# Patient Record
Sex: Male | Born: 1980
Health system: Southern US, Community
[De-identification: ages and names within clinical notes are randomized; demographics above are authoritative.]

## PROBLEM LIST (undated history)

## (undated) DIAGNOSIS — K859 Acute pancreatitis without necrosis or infection, unspecified: Secondary | ICD-10-CM

## (undated) DIAGNOSIS — K5792 Diverticulitis of intestine, part unspecified, without perforation or abscess without bleeding: Secondary | ICD-10-CM

## (undated) DIAGNOSIS — N200 Calculus of kidney: Secondary | ICD-10-CM

## (undated) DIAGNOSIS — R12 Heartburn: Secondary | ICD-10-CM

## (undated) HISTORY — DX: Acute pancreatitis without necrosis or infection, unspecified: K85.90

## (undated) HISTORY — DX: Diverticulitis of intestine, part unspecified, without perforation or abscess without bleeding: K57.92

## (undated) HISTORY — DX: Calculus of kidney: N20.0

## (undated) HISTORY — DX: Heartburn: R12

## (undated) HISTORY — PX: CHOLECYSTECTOMY: SHX55

---

## 2000-10-29 HISTORY — PX: CHOLECYSTECTOMY: SHX55

## 2003-10-30 DIAGNOSIS — K859 Acute pancreatitis without necrosis or infection, unspecified: Secondary | ICD-10-CM

## 2003-10-30 HISTORY — DX: Acute pancreatitis without necrosis or infection, unspecified: K85.90

## 2007-04-27 ENCOUNTER — Emergency Department: Payer: Self-pay | Admitting: Emergency Medicine

## 2011-03-31 ENCOUNTER — Emergency Department: Payer: Self-pay | Admitting: Emergency Medicine

## 2018-07-31 ENCOUNTER — Encounter: Payer: Self-pay | Admitting: Nurse Practitioner

## 2018-07-31 ENCOUNTER — Ambulatory Visit (INDEPENDENT_AMBULATORY_CARE_PROVIDER_SITE_OTHER): Payer: Commercial Managed Care - PPO | Admitting: Nurse Practitioner

## 2018-07-31 ENCOUNTER — Other Ambulatory Visit: Payer: Self-pay

## 2018-07-31 VITALS — BP 121/68 | HR 84 | Temp 98.3°F | Ht 64.0 in | Wt 160.4 lb

## 2018-07-31 DIAGNOSIS — N5089 Other specified disorders of the male genital organs: Secondary | ICD-10-CM | POA: Diagnosis not present

## 2018-07-31 DIAGNOSIS — Z7689 Persons encountering health services in other specified circumstances: Secondary | ICD-10-CM

## 2018-07-31 DIAGNOSIS — Z789 Other specified health status: Secondary | ICD-10-CM

## 2018-07-31 DIAGNOSIS — R42 Dizziness and giddiness: Secondary | ICD-10-CM

## 2018-07-31 NOTE — Patient Instructions (Addendum)
Carlos Davis,   Thank you for coming in to clinic today.  1. Because of your history of testicular cancer, would recommend ultrasound and evaluation for testicular cancer.  2. Drink plenty of fluids with electrolytes to help reduce your dizziness. - Consider slowly increasing the amount of carbohydrate you are consuming to prevent dizziness. - If not increasing carbs, increase some of your fat intake until you are reaching your weight loss goals for your Keto/low carb diet.   You will be due for FASTING BLOOD WORK.  This means you should eat no food or drink after midnight.  Drink only water or coffee without cream/sugar on the morning of your lab visit. - Please go ahead and schedule a "Lab Only" visit in the morning at the clinic for lab draw in the next 7 days. - Your results will be available about 2-3 days after blood draw.  If you have set up a MyChart account, you can can log in to MyChart online to view your results and a brief explanation. Also, we can discuss your results together at your next office visit if you would like.  Please schedule a follow-up appointment with Wilhelmina Mcardle, AGNP. Return in about 3 months (around 10/31/2018) for annual physical.  If you have any other questions or concerns, please feel free to call the clinic or send a message through MyChart. You may also schedule an earlier appointment if necessary.  You will receive a survey after today's visit either digitally by e-mail or paper by [employer]. Your experiences and feedback matter to [place].  Please respond so we know how we are doing as we provide care for you.   Wilhelmina Mcardle, DNP, AGNP-BC Adult Gerontology Nurse Practitioner [facility], [facility]

## 2018-07-31 NOTE — Progress Notes (Signed)
Subjective:    Patient ID: Carlos Davis, male    DOB: May 11, 1981, 37 y.o.   MRN: [id number]  Carlos Davis is a 37 y.o. male presenting on 07/31/2018 for Establish Care (lump on left testicle x 2 weeks ago) and Dizziness (pt currently on the Keto diet x 9 mths and been noticing over the pass three months he's been having symptoms of feeling really lightheadedness and admits sometimes he feel like everything is spinning )   HPI Establish Care New Provider Pt has had no recent PCP.  Other care recently received at [facility] for bone spurs.  Cholecystectomy, pancreatitis 18 years ago.    Dizziness Has taken breaks from pseudo-Keto - low carb (< 30g) - not high fat, Last break 2 weeks ago.  Dizziness x 3-4 months not daily.  Occurs most often in mornings.  Worst was at work.  Ate candy and felt much better.   - Orthostasis, worse with moving from bending, standing up rapidly.  Testicular Lump LEFT Present x 2 months or longer.  Is not painful.  1 year ago was having pain that radiated up to stomach lasted x 1 week and resolved.   - Fam history testicular cancer pat uncle.  Past Medical History:  Diagnosis Date  . Pancreatitis    Past Surgical History:  Procedure Laterality Date  . CHOLECYSTECTOMY    . CHOLECYSTECTOMY  2002   Social History   Socioeconomic History  . Marital status: Married    Spouse name: Not on file  . Number of children: 1  . Years of education: Not on file  . Highest education level: GED or equivalent  Occupational History  . Not on file  Social Needs  . Financial resource strain: Not on file  . Food insecurity:    Worry: Not on file    Inability: Not on file  . Transportation needs:    Medical: Not on file    Non-medical: Not on file  Tobacco Use  . Smoking status: Current Every Day Smoker    Packs/day: 0.10    Types: Cigarettes  . Smokeless tobacco: Current User    Types: Snuff  . Tobacco comment: uses 1 can dip per day, 1 pack  cigs per month  Substance and Sexual Activity  . Alcohol use: Yes    Alcohol/week: 12.0 standard drinks    Types: 12 Cans of beer per week    Frequency: Never    Comment: weekly  . Drug use: Not Currently    Comment: > 18 years ago, used hallucinogenics x 5 years  . Sexual activity: Yes    Comment: Partner - IUD  Lifestyle  . Physical activity:    Days per week: Not on file    Minutes per session: Not on file  . Stress: Not on file  Relationships  . Social connections:    Talks on phone: Not on file    Gets together: Not on file    Attends religious service: Not on file    Active member of club or organization: Not on file    Attends meetings of clubs or organizations: Not on file    Relationship status: Not on file  . Intimate partner violence:    Fear of current or ex partner: Not on file    Emotionally abused: Not on file    Physically abused: Not on file    Forced sexual activity: Not on file  Other Topics Concern  . Not on  file  Social History Narrative  . Not on file   Family History  Problem Relation Age of Onset  . Diabetes Maternal Grandmother   . Diabetes Paternal Grandmother   . Charcot-Marie-Tooth disease Mother   . Arthritis Father   . High blood pressure Father   . Charcot-Marie-Tooth disease Other   . Other Daughter        Hydronephrosis (intrauterine)  . Colon cancer Maternal Grandfather   . Heart attack Maternal Grandfather   . Aneurysm Paternal Grandfather        Brain, sudden death  . Charcot-Marie-Tooth disease Other    No current outpatient medications on file prior to visit.   No current facility-administered medications on file prior to visit.     Review of Systems  Constitutional: Negative for activity change, appetite change, fatigue and unexpected weight change.  HENT: Negative for congestion, hearing loss and trouble swallowing.   Eyes: Negative for visual disturbance.  Respiratory: Negative for choking, shortness of breath and  wheezing.   Cardiovascular: Negative for chest pain and palpitations.  Gastrointestinal: Negative for abdominal pain, blood in stool, constipation and diarrhea.  Genitourinary: Negative for difficulty urinating, discharge, flank pain, genital sores, penile pain, penile swelling, scrotal swelling and testicular pain.  Musculoskeletal: Negative for arthralgias, back pain and myalgias.  Skin: Negative for color change, rash and wound.  Allergic/Immunologic: Negative for environmental allergies.  Neurological: Negative for dizziness, seizures, weakness and headaches.  Psychiatric/Behavioral: Negative for behavioral problems, decreased concentration, dysphoric mood, sleep disturbance and suicidal ideas. The patient is not nervous/anxious.    Per HPI unless specifically indicated above     Objective:    BP 121/68 (BP Location: Right Arm, Patient Position: Sitting, Cuff Size: Normal)   Pulse 84   Temp 98.3 F (36.8 C) (Oral)   Ht 5\' 4"  (1.626 m)   Wt 160 lb 6.4 oz (72.8 kg)   BMI 27.53 kg/m   Wt Readings from Last 3 Encounters:  07/31/18 160 lb 6.4 oz (72.8 kg)    Physical Exam  Constitutional: He is oriented to person, place, and time. He appears well-developed and well-nourished. No distress.  HENT:  Head: Normocephalic and atraumatic.  Cardiovascular: Normal rate, regular rhythm, normal heart sounds and intact distal pulses.  Pulmonary/Chest: Effort normal and breath sounds normal. No respiratory distress.  Neurological: He is alert and oriented to person, place, and time.  Skin: Skin is warm and dry.  Psychiatric: He has a normal mood and affect. His behavior is normal.  Vitals reviewed.    Results for orders placed or performed in visit on 07/31/18  COMPLETE METABOLIC PANEL WITH GFR  Result Value Ref Range   Glucose, Bld 89 65 - 99 mg/dL   BUN 18 7 - 25 mg/dL   Creat 8.11 9.14 - 7.82 mg/dL   GFR, Est Non African American 115 > OR = 60 mL/min/1.14m2   GFR, Est African  American 134 > OR = 60 mL/min/1.49m2   BUN/Creatinine Ratio NOT APPLICABLE 6 - 22 (calc)   Sodium 141 135 - 146 mmol/L   Potassium 4.7 3.5 - 5.3 mmol/L   Chloride 106 98 - 110 mmol/L   CO2 27 20 - 32 mmol/L   Calcium 9.8 8.6 - 10.3 mg/dL   Total Protein 7.0 6.1 - 8.1 g/dL   Albumin 5.0 3.6 - 5.1 g/dL   Globulin 2.0 1.9 - 3.7 g/dL (calc)   AG Ratio 2.5 1.0 - 2.5 (calc)   Total Bilirubin 0.4 0.2 -  1.2 mg/dL   Alkaline phosphatase (APISO) 58 40 - 115 U/L   AST 14 10 - 40 U/L   ALT 19 9 - 46 U/L  Magnesium  Result Value Ref Range   Magnesium 2.1 1.5 - 2.5 mg/dL  Phosphorus  Result Value Ref Range   Phosphorus 3.7 2.5 - 4.5 mg/dL  CBC with Differential/Platelet  Result Value Ref Range   WBC 7.7 3.8 - 10.8 Thousand/uL   RBC 5.22 4.20 - 5.80 Million/uL   Hemoglobin 14.8 13.2 - 17.1 g/dL   HCT 16.1 09.6 - 04.5 %   MCV 86.0 80.0 - 100.0 fL   MCH 28.4 27.0 - 33.0 pg   MCHC 33.0 32.0 - 36.0 g/dL   RDW 40.9 81.1 - 91.4 %   Platelets 251 140 - 400 Thousand/uL   MPV 10.0 7.5 - 12.5 fL   Neutro Abs 5,020 1,500 - 7,800 cells/uL   Lymphs Abs 1,840 850 - 3,900 cells/uL   WBC mixed population 570 200 - 950 cells/uL   Eosinophils Absolute 231 15 - 500 cells/uL   Basophils Absolute 39 0 - 200 cells/uL   Neutrophils Relative % 65.2 %   Total Lymphocyte 23.9 %   Monocytes Relative 7.4 %   Eosinophils Relative 3.0 %   Basophils Relative 0.5 %  TSH  Result Value Ref Range   TSH 1.02 0.40 - 4.50 mIU/L      Assessment & Plan:   Problem List Items Addressed This Visit    None    Visit Diagnoses    Dizziness    -  Primary See AP patient on ketogenic diet.   Relevant Orders   COMPLETE METABOLIC PANEL WITH GFR (Completed)   Magnesium (Completed)   Phosphorus (Completed)   CBC with Differential/Platelet (Completed)   TSH (Completed)   Encounter to establish care     Previous PCP was > 10 years ago.  Records will not be requested.  Past medical, family, and surgical history reviewed w/  pt.     Patient on ketogenic diet     Patient with dizziness and is on keto diet which typically can cause these symptoms.   - Encouraged patient to increase fluids with electrolytes - Consider transitioning off keto to low glycemic/low carb (up to 80 g per day),  - Labs to check for causes of dizziness. - FOLLOW-UP prn   Relevant Orders   COMPLETE METABOLIC PANEL WITH GFR (Completed)   Magnesium (Completed)   Phosphorus (Completed)   CBC with Differential/Platelet (Completed)   Testicular lump     New testicular lump with fam history testicular cancer.   - Recommend referral to urology for ultrasound to screen.  Low risk for age. - Follow-up prn.   Relevant Orders   Ambulatory referral to Urology       Follow up plan: Return in about 3 months (around 10/31/2018) for annual physical.  Wilhelmina Mcardle, DNP, AGPCNP-BC Adult Gerontology Primary Care Nurse Practitioner [facility] 07/31/2018, 2:27 PM

## 2018-08-01 ENCOUNTER — Other Ambulatory Visit: Payer: Commercial Managed Care - PPO

## 2018-08-01 DIAGNOSIS — R42 Dizziness and giddiness: Secondary | ICD-10-CM | POA: Diagnosis not present

## 2018-08-01 DIAGNOSIS — Z789 Other specified health status: Secondary | ICD-10-CM | POA: Diagnosis not present

## 2018-08-02 LAB — CBC WITH DIFFERENTIAL/PLATELET
Basophils Absolute: 39 cells/uL (ref 0–200)
Basophils Relative: 0.5 %
Eosinophils Absolute: 231 cells/uL (ref 15–500)
Eosinophils Relative: 3 %
HCT: 44.9 % (ref 38.5–50.0)
Hemoglobin: 14.8 g/dL (ref 13.2–17.1)
Lymphs Abs: 1840 cells/uL (ref 850–3900)
MCH: 28.4 pg (ref 27.0–33.0)
MCHC: 33 g/dL (ref 32.0–36.0)
MCV: 86 fL (ref 80.0–100.0)
MPV: 10 fL (ref 7.5–12.5)
Monocytes Relative: 7.4 %
Neutro Abs: 5020 cells/uL (ref 1500–7800)
Neutrophils Relative %: 65.2 %
Platelets: 251 10*3/uL (ref 140–400)
RBC: 5.22 10*6/uL (ref 4.20–5.80)
RDW: 12.8 % (ref 11.0–15.0)
Total Lymphocyte: 23.9 %
WBC mixed population: 570 cells/uL (ref 200–950)
WBC: 7.7 10*3/uL (ref 3.8–10.8)

## 2018-08-02 LAB — COMPLETE METABOLIC PANEL WITH GFR
AG Ratio: 2.5 (calc) (ref 1.0–2.5)
ALT: 19 U/L (ref 9–46)
AST: 14 U/L (ref 10–40)
Albumin: 5 g/dL (ref 3.6–5.1)
Alkaline phosphatase (APISO): 58 U/L (ref 40–115)
BUN: 18 mg/dL (ref 7–25)
CO2: 27 mmol/L (ref 20–32)
Calcium: 9.8 mg/dL (ref 8.6–10.3)
Chloride: 106 mmol/L (ref 98–110)
Creat: 0.78 mg/dL (ref 0.60–1.35)
GFR, Est African American: 134 mL/min/{1.73_m2} (ref >=60)
GFR, Est Non African American: 115 mL/min/{1.73_m2} (ref >=60)
Globulin: 2 g/dL (calc) (ref 1.9–3.7)
Glucose, Bld: 89 mg/dL (ref 65–99)
Potassium: 4.7 mmol/L (ref 3.5–5.3)
Sodium: 141 mmol/L (ref 135–146)
Total Bilirubin: 0.4 mg/dL (ref 0.2–1.2)
Total Protein: 7 g/dL (ref 6.1–8.1)

## 2018-08-02 LAB — PHOSPHORUS: Phosphorus: 3.7 mg/dL (ref 2.5–4.5)

## 2018-08-02 LAB — MAGNESIUM: Magnesium: 2.1 mg/dL (ref 1.5–2.5)

## 2018-08-02 LAB — TSH: TSH: 1.02 mIU/L (ref 0.40–4.50)

## 2018-08-07 ENCOUNTER — Ambulatory Visit: Payer: Commercial Managed Care - PPO | Admitting: Urology

## 2018-08-13 ENCOUNTER — Ambulatory Visit (INDEPENDENT_AMBULATORY_CARE_PROVIDER_SITE_OTHER): Payer: Commercial Managed Care - PPO | Admitting: Urology

## 2018-08-13 ENCOUNTER — Encounter: Payer: Self-pay | Admitting: Urology

## 2018-08-13 VITALS — BP 121/76 | Ht 65.0 in | Wt 165.1 lb

## 2018-08-13 DIAGNOSIS — N5089 Other specified disorders of the male genital organs: Secondary | ICD-10-CM | POA: Diagnosis not present

## 2018-08-13 NOTE — Progress Notes (Signed)
08/13/2018 2:57 PM   Carlos Davis [birth year] [id number]  Referring provider: Galen Manila, NP [address] [place], [place] [address]  Chief Complaint  Patient presents with  . Testicular Mass    HPI: Patient is a 37 year old Afro-American male who is referred to [place] by Camillia Herter, NP for a testicular lump.  He states that about 2 months ago he discovered a lump in his left hemiscrotum during self exam.  He states the lump is not painful.  It is not changed in size or consistency since it was first discovered.  He denies any history of infection, trauma or surgery to the genitourinary area.  Approximately 1 year ago he was having pain in his left testicle that radiated up into his abdomen which lasted 1 week and resolved.  He has no urinary symptoms today.  Patient denies any gross hematuria, dysuria or suprapubic/flank pain.  Patient denies any fevers, chills, nausea or vomiting.      He is not having difficulty with erections, penile discharge, discolored ejaculate fluid or pain with ejaculation.   His paternal uncle was diagnosed with testicular cancer.  PMH: Past Medical History:  Diagnosis Date  . Pancreatitis     Surgical History: Cholecystectomy 2002  Home Medications:  Allergies as of 08/13/2018   No Known Allergies     Medication List    as of 08/13/2018  2:57 PM   You have not been prescribed any medications.     Allergies: No Known Allergies  Family History: Family History  Problem Relation Age of Onset  . Diabetes Maternal Grandmother   . Diabetes Paternal Grandmother   . Charcot-Marie-Tooth disease Mother   . Arthritis Father   . High blood pressure Father   . Charcot-Marie-Tooth disease Other   . Other Daughter        Hydronephrosis (intrauterine)  . Colon cancer Maternal Grandfather   . Heart attack Maternal Grandfather   . Aneurysm Paternal Grandfather        Brain, sudden death  . Charcot-Marie-Tooth disease Other      Social History:  reports that he has been smoking cigarettes. He has been smoking about 0.25 packs per day. His smokeless tobacco use includes snuff. He reports that he drinks about 12.0 standard drinks of alcohol per week. He reports that he has current or past drug history.  ROS: UROLOGY Frequent Urination?: Yes Hard to postpone urination?: No Burning/pain with urination?: No Get up at night to urinate?: Yes Leakage of urine?: No Urine stream starts and stops?: No Trouble starting stream?: No Do you have to strain to urinate?: No Blood in urine?: No Urinary tract infection?: No Sexually transmitted disease?: No Injury to kidneys or bladder?: No Painful intercourse?: No Weak stream?: No Erection problems?: No Penile pain?: No  Gastrointestinal Nausea?: No Vomiting?: No Indigestion/heartburn?: No Diarrhea?: No Constipation?: No  Constitutional Fever: No Night sweats?: No Weight loss?: No Fatigue?: No  Skin Skin rash/lesions?: No Itching?: No  Eyes Blurred vision?: No Double vision?: No  Ears/Nose/Throat Sore throat?: No Sinus problems?: No  Hematologic/Lymphatic Swollen glands?: No Easy bruising?: No  Cardiovascular Leg swelling?: No Chest pain?: No  Respiratory Cough?: No Shortness of breath?: No  Endocrine Excessive thirst?: No  Musculoskeletal Back pain?: No Joint pain?: No  Neurological Headaches?: No Dizziness?: No  Psychologic Depression?: No Anxiety?: No  Physical Exam: BP 121/76 (BP Location: Left Arm, Patient Position: Sitting, Cuff Size: Normal)   Ht 5\' 5"  (1.651 m)  Wt 165 lb 1.6 oz (74.9 kg)   BMI 27.47 kg/m   Constitutional:  Well nourished. Alert and oriented, No acute distress. HEENT:  AT, moist mucus membranes.  Trachea midline, no masses. Cardiovascular: No clubbing, cyanosis, or edema. Respiratory: Normal respiratory effort, no increased work of breathing. GI: Abdomen is soft, non tender, non distended, no  abdominal masses. Liver and spleen not palpable.  No hernias appreciated.  Stool sample for occult testing is not indicated.   GU: No CVA tenderness.  No bladder fullness or masses.  Patient with circumcised phallus.  Urethral meatus is patent.  No penile discharge. No penile lesions or rashes. Scrotum without lesions, cysts, rashes and/or edema.  Testicles are located scrotally bilaterally. No masses are appreciated in the testicles. Left epididymis mass non tender and non indurated.  Right epididymis is normal.   Rectal: Patient with  normal sphincter tone. Anus and perineum without scarring or rashes. No rectal masses are appreciated. Prostate is approximately 45 grams, no nodules are appreciated. Seminal vesicles are normal. Skin: No rashes, bruises or suspicious lesions. Lymph: No cervical or inguinal adenopathy. Neurologic: Grossly intact, no focal deficits, moving all 4 extremities. Psychiatric: Normal mood and affect.  Laboratory Data: Lab Results  Component Value Date   WBC 7.7 08/01/2018   HGB 14.8 08/01/2018   HCT 44.9 08/01/2018   MCV 86.0 08/01/2018   PLT 251 08/01/2018    Lab Results  Component Value Date   CREATININE 0.78 08/01/2018    No results found for: PSA  No results found for: TESTOSTERONE  No results found for: HGBA1C  Lab Results  Component Value Date   TSH 1.02 08/01/2018    No results found for: CHOL, HDL, CHOLHDL, VLDL, LDLCALC  Lab Results  Component Value Date   AST 14 08/01/2018   Lab Results  Component Value Date   ALT 19 08/01/2018   No components found for: ALKALINEPHOPHATASE No components found for: BILIRUBINTOTAL  No results found for: ESTRADIOL  Urinalysis No results found for: COLORURINE, APPEARANCEUR, LABSPEC, PHURINE, GLUCOSEU, HGBUR, BILIRUBINUR, KETONESUR, PROTEINUR, UROBILINOGEN, NITRITE, LEUKOCYTESUR  I have reviewed the labs.     Assessment & Plan:    1.  Left epididymal mass Likely an epididymal cyst, but will  obtain scrotal ultrasound for confirmation  Return for I will call patient with results.  These notes generated with voice recognition software. I apologize for typographical errors.  Michiel Cowboy, PA-C  [facility] [address]  [address] [place], [place] [address] [phone]

## 2018-08-20 ENCOUNTER — Ambulatory Visit
Admission: RE | Admit: 2018-08-20 | Discharge: 2018-08-20 | Disposition: A | Discharge status: Home / Self Care | Payer: Commercial Managed Care - PPO | Source: Ambulatory Visit | Attending: Urology | Admitting: Urology

## 2018-08-20 DIAGNOSIS — N503 Cyst of epididymis: Secondary | ICD-10-CM | POA: Diagnosis not present

## 2018-08-20 DIAGNOSIS — N5089 Other specified disorders of the male genital organs: Secondary | ICD-10-CM

## 2018-08-21 ENCOUNTER — Telehealth: Payer: Self-pay

## 2018-08-21 NOTE — Telephone Encounter (Signed)
-----   Message from Harle Battiest, PA-C sent at 08/21/2018  2:10 PM EDT ----- Please let the patient know that his scrotal ultrasound was negative for cancer and that the area that is palpated in his left scrotum is an epididymal cyst and it is benign.  No further follow-up is needed.

## 2018-08-21 NOTE — Telephone Encounter (Signed)
Patient notified

## 2018-09-24 ENCOUNTER — Encounter: Payer: Self-pay | Admitting: Nurse Practitioner

## 2018-10-31 ENCOUNTER — Other Ambulatory Visit: Payer: Self-pay

## 2018-10-31 ENCOUNTER — Ambulatory Visit (INDEPENDENT_AMBULATORY_CARE_PROVIDER_SITE_OTHER): Payer: Commercial Managed Care - PPO | Admitting: Nurse Practitioner

## 2018-10-31 ENCOUNTER — Encounter: Payer: Self-pay | Admitting: Nurse Practitioner

## 2018-10-31 VITALS — BP 111/65 | HR 67 | Temp 98.2°F | Ht 65.0 in | Wt 172.8 lb

## 2018-10-31 DIAGNOSIS — Z23 Encounter for immunization: Secondary | ICD-10-CM

## 2018-10-31 DIAGNOSIS — Z0000 Encounter for general adult medical examination without abnormal findings: Secondary | ICD-10-CM | POA: Diagnosis not present

## 2018-10-31 NOTE — Progress Notes (Signed)
Subjective:    Patient ID: Carlos Davis, male    DOB: [birth year], 38 y.o.   MRN: [id number]  Carlos Davis is a 38 y.o. male presenting on 10/31/2018 for Annual Exam   HPI Annual Physical Exam Patient has been feeling well.  They have no acute concerns today. Sleeps 8-9 hours per night usually uninterrupted, but occasionally 1 x for urination  HEALTH MAINTENANCE: Weight/BMI: Generally healthy, overweight Physical activity: work, but none regularly outside other than housework/yardwork Diet: Keto diet resumed after 3 months off. Seatbelt: always Sunscreen: not regular HIV: declines - due to low risk Optometry: not regularly Dentistry: not regular, has insurance  VACCINES: Tetanus: last when joined [facility] approx 10 years ago.   Influenza: due - patient declines - received from [facility] this past year   Past Medical History:  Diagnosis Date  . Pancreatitis    Past Surgical History:  Procedure Laterality Date  . CHOLECYSTECTOMY    . CHOLECYSTECTOMY  2002   Social History   Socioeconomic History  . Marital status: Married    Spouse name: Not on file  . Number of children: 1  . Years of education: Not on file  . Highest education level: GED or equivalent  Occupational History  . Not on file  Social Needs  . Financial resource strain: Not on file  . Food insecurity:    Worry: Not on file    Inability: Not on file  . Transportation needs:    Medical: Not on file    Non-medical: Not on file  Tobacco Use  . Smoking status: Current Every Day Smoker    Packs/day: 0.10    Types: Cigarettes  . Smokeless tobacco: Current User    Types: Snuff  . Tobacco comment: uses 1 can dip per day, 1 pack cigs per month  Substance and Sexual Activity  . Alcohol use: Yes    Alcohol/week: 12.0 standard drinks    Types: 12 Cans of beer per week    Frequency: Never    Comment: weekly  . Drug use: Not Currently    Comment: > 18 years ago, used hallucinogenics x 5 years  . Sexual  activity: Yes    Comment: Partner - IUD  Lifestyle  . Physical activity:    Days per week: Not on file    Minutes per session: Not on file  . Stress: Not on file  Relationships  . Social connections:    Talks on phone: Not on file    Gets together: Not on file    Attends religious service: Not on file    Active member of club or organization: Not on file    Attends meetings of clubs or organizations: Not on file    Relationship status: Not on file  . Intimate partner violence:    Fear of current or ex partner: Not on file    Emotionally abused: Not on file    Physically abused: Not on file    Forced sexual activity: Not on file  Other Topics Concern  . Not on file  Social History Narrative  . Not on file   Family History  Problem Relation Age of Onset  . Diabetes Maternal Grandmother   . Diabetes Paternal Grandmother   . Charcot-Marie-Tooth disease Mother   . Arthritis Father   . High blood pressure Father   . Charcot-Marie-Tooth disease Other   . Other Daughter        Hydronephrosis (intrauterine)  . Colon cancer Maternal Grandfather   .  Heart attack Maternal Grandfather   . Aneurysm Paternal Grandfather        Brain, sudden death  . Charcot-Marie-Tooth disease Other    No current outpatient medications on file prior to visit.   No current facility-administered medications on file prior to visit.     Review of Systems  Constitutional: Negative for activity change, appetite change, fatigue and unexpected weight change.  HENT: Negative for congestion, hearing loss and trouble swallowing.   Eyes: Negative for visual disturbance.  Respiratory: Negative for choking, shortness of breath and wheezing.   Cardiovascular: Negative for chest pain and palpitations.  Gastrointestinal: Negative for abdominal pain, blood in stool, constipation and diarrhea.  Genitourinary: Negative for difficulty urinating, discharge, flank pain, genital sores, penile pain, penile swelling,  scrotal swelling and testicular pain.  Musculoskeletal: Negative for arthralgias, back pain and myalgias.  Skin: Negative for color change, rash and wound.  Allergic/Immunologic: Negative for environmental allergies.  Neurological: Negative for dizziness, seizures, weakness and headaches.  Psychiatric/Behavioral: Negative for behavioral problems, decreased concentration, dysphoric mood, sleep disturbance and suicidal ideas. The patient is not nervous/anxious.    Per HPI unless specifically indicated above    Objective:    BP 111/65 (BP Location: Right Arm, Patient Position: Sitting, Cuff Size: Normal)   Pulse 67   Temp 98.2 F (36.8 C) (Oral)   Ht 5\' 5"  (1.651 m)   Wt 172 lb 12.8 oz (78.4 kg)   BMI 28.76 kg/m   Wt Readings from Last 3 Encounters:  10/31/18 172 lb 12.8 oz (78.4 kg)  08/13/18 165 lb 1.6 oz (74.9 kg)  07/31/18 160 lb 6.4 oz (72.8 kg)    Physical Exam Vitals signs and nursing note reviewed.  Constitutional:      General: He is not in acute distress.    Appearance: He is well-developed.  HENT:     Head: Normocephalic and atraumatic.     Right Ear: External ear normal.     Left Ear: External ear normal.     Nose: Nose normal.  Eyes:     Conjunctiva/sclera: Conjunctivae normal.     Pupils: Pupils are equal, round, and reactive to light.  Neck:     Musculoskeletal: Normal range of motion and neck supple.     Thyroid: No thyromegaly.     Vascular: No JVD.     Trachea: No tracheal deviation.  Cardiovascular:     Rate and Rhythm: Normal rate and regular rhythm.     Heart sounds: Normal heart sounds. No murmur. No friction rub. No gallop.   Pulmonary:     Effort: Pulmonary effort is normal. No respiratory distress.     Breath sounds: Normal breath sounds.  Abdominal:     General: Bowel sounds are normal. There is no distension.     Palpations: Abdomen is soft.     Tenderness: There is no abdominal tenderness.  Musculoskeletal: Normal range of motion.    Lymphadenopathy:     Cervical: No cervical adenopathy.  Skin:    General: Skin is warm and dry.     Capillary Refill: Capillary refill takes less than 2 seconds.  Neurological:     Mental Status: He is alert and oriented to person, place, and time.     Cranial Nerves: No cranial nerve deficit.  Psychiatric:        Behavior: Behavior normal.        Thought Content: Thought content normal.        Judgment: Judgment normal.  Results for orders placed or performed in visit on 07/31/18  COMPLETE METABOLIC PANEL WITH GFR  Result Value Ref Range   Glucose, Bld 89 65 - 99 mg/dL   BUN 18 7 - 25 mg/dL   Creat 1.61 0.96 - 0.45 mg/dL   GFR, Est Non African American 115 > OR = 60 mL/min/1.75m2   GFR, Est African American 134 > OR = 60 mL/min/1.67m2   BUN/Creatinine Ratio NOT APPLICABLE 6 - 22 (calc)   Sodium 141 135 - 146 mmol/L   Potassium 4.7 3.5 - 5.3 mmol/L   Chloride 106 98 - 110 mmol/L   CO2 27 20 - 32 mmol/L   Calcium 9.8 8.6 - 10.3 mg/dL   Total Protein 7.0 6.1 - 8.1 g/dL   Albumin 5.0 3.6 - 5.1 g/dL   Globulin 2.0 1.9 - 3.7 g/dL (calc)   AG Ratio 2.5 1.0 - 2.5 (calc)   Total Bilirubin 0.4 0.2 - 1.2 mg/dL   Alkaline phosphatase (APISO) 58 40 - 115 U/L   AST 14 10 - 40 U/L   ALT 19 9 - 46 U/L  Magnesium  Result Value Ref Range   Magnesium 2.1 1.5 - 2.5 mg/dL  Phosphorus  Result Value Ref Range   Phosphorus 3.7 2.5 - 4.5 mg/dL  CBC with Differential/Platelet  Result Value Ref Range   WBC 7.7 3.8 - 10.8 Thousand/uL   RBC 5.22 4.20 - 5.80 Million/uL   Hemoglobin 14.8 13.2 - 17.1 g/dL   HCT 40.9 81.1 - 91.4 %   MCV 86.0 80.0 - 100.0 fL   MCH 28.4 27.0 - 33.0 pg   MCHC 33.0 32.0 - 36.0 g/dL   RDW 78.2 95.6 - 21.3 %   Platelets 251 140 - 400 Thousand/uL   MPV 10.0 7.5 - 12.5 fL   Neutro Abs 5,020 1,500 - 7,800 cells/uL   Lymphs Abs 1,840 850 - 3,900 cells/uL   WBC mixed population 570 200 - 950 cells/uL   Eosinophils Absolute 231 15 - 500 cells/uL   Basophils  Absolute 39 0 - 200 cells/uL   Neutrophils Relative % 65.2 %   Total Lymphocyte 23.9 %   Monocytes Relative 7.4 %   Eosinophils Relative 3.0 %   Basophils Relative 0.5 %  TSH  Result Value Ref Range   TSH 1.02 0.40 - 4.50 mIU/L      Assessment & Plan:   Problem List Items Addressed This Visit    None    Visit Diagnoses    Encounter for annual physical exam    -  Primary   Relevant Orders   Hemoglobin A1c   CBC with Differential/Platelet   COMPLETE METABOLIC PANEL WITH GFR   Lipid panel   TSH   Need for diphtheria-tetanus-pertussis (Tdap) vaccine       Relevant Orders   Tdap vaccine greater than or equal to 7yo IM    Annual physical exam with no new findings.  Well adult with no acute concerns.  Plan: 1. Obtain health maintenance screenings as above according to age. - Increase physical activity to 30 minutes most days of the week.  - Eat healthy diet high in vegetables and fruits; low in refined carbohydrates. - Labs as above in next 7 days. - Patient due for Tdap - will receive today. 2. Return 1 year for annual physical.    Follow up plan: Return in about 1 year (around 11/01/2019) for annual physical.  Wilhelmina Mcardle, DNP, AGPCNP-BC Adult Gerontology Primary Care Nurse  Practitioner [facility] 10/31/2018, 2:28 PM

## 2018-10-31 NOTE — Patient Instructions (Addendum)
Carlos Davis,   Thank you for coming in to clinic today.  1. You will be due for FASTING BLOOD WORK.  This means you should eat no food or drink after midnight.  Drink only water or coffee without cream/sugar on the morning of your lab visit. - Please go ahead and schedule a "Lab Only" visit in the morning at the clinic for lab draw in the next 7 days. - Your results will be available about 2-3 days after blood draw.  If you have set up a MyChart account, you can can log in to MyChart online to view your results and a brief explanation. Also, we can discuss your results together at your next office visit if you would like.  2. Colon cancer screening for early family history at age 65.  Please schedule a follow-up appointment with Wilhelmina Mcardle, AGNP. Return in about 1 year (around 11/01/2019) for annual physical.  If you have any other questions or concerns, please feel free to call the clinic or send a message through MyChart. You may also schedule an earlier appointment if necessary.  You will receive a survey after today's visit either digitally by e-mail or paper by [employer]. Your experiences and feedback matter to [place].  Please respond so we know how we are doing as we provide care for you.   Wilhelmina Mcardle, DNP, AGNP-BC Adult Gerontology Nurse Practitioner [facility], [facility]

## 2018-11-03 ENCOUNTER — Encounter: Payer: Self-pay | Admitting: Nurse Practitioner

## 2018-11-05 DIAGNOSIS — Z0000 Encounter for general adult medical examination without abnormal findings: Secondary | ICD-10-CM | POA: Diagnosis not present

## 2018-11-06 LAB — CBC WITH DIFFERENTIAL/PLATELET
Absolute Monocytes: 529 cells/uL (ref 200–950)
Basophils Absolute: 19 cells/uL (ref 0–200)
Basophils Relative: 0.3 %
Eosinophils Absolute: 120 cells/uL (ref 15–500)
Eosinophils Relative: 1.9 %
HCT: 42.3 % (ref 38.5–50.0)
Hemoglobin: 14.8 g/dL (ref 13.2–17.1)
Lymphs Abs: 1758 cells/uL (ref 850–3900)
MCH: 29.4 pg (ref 27.0–33.0)
MCHC: 35 g/dL (ref 32.0–36.0)
MCV: 84.1 fL (ref 80.0–100.0)
MPV: 10.6 fL (ref 7.5–12.5)
Monocytes Relative: 8.4 %
Neutro Abs: 3875 cells/uL (ref 1500–7800)
Neutrophils Relative %: 61.5 %
Platelets: 237 10*3/uL (ref 140–400)
RBC: 5.03 10*6/uL (ref 4.20–5.80)
RDW: 12.9 % (ref 11.0–15.0)
Total Lymphocyte: 27.9 %
WBC: 6.3 10*3/uL (ref 3.8–10.8)

## 2018-11-06 LAB — COMPLETE METABOLIC PANEL WITH GFR
AG Ratio: 2.2 (calc) (ref 1.0–2.5)
ALT: 23 U/L (ref 9–46)
AST: 15 U/L (ref 10–40)
Albumin: 4.9 g/dL (ref 3.6–5.1)
Alkaline phosphatase (APISO): 53 U/L (ref 40–115)
BUN: 19 mg/dL (ref 7–25)
CO2: 22 mmol/L (ref 20–32)
Calcium: 9.7 mg/dL (ref 8.6–10.3)
Chloride: 106 mmol/L (ref 98–110)
Creat: 0.71 mg/dL (ref 0.60–1.35)
GFR, Est African American: 139 mL/min/{1.73_m2} (ref >=60)
GFR, Est Non African American: 120 mL/min/{1.73_m2} (ref >=60)
Globulin: 2.2 g/dL (calc) (ref 1.9–3.7)
Glucose, Bld: 88 mg/dL (ref 65–99)
Potassium: 4.3 mmol/L (ref 3.5–5.3)
Sodium: 139 mmol/L (ref 135–146)
Total Bilirubin: 0.4 mg/dL (ref 0.2–1.2)
Total Protein: 7.1 g/dL (ref 6.1–8.1)

## 2018-11-06 LAB — HEMOGLOBIN A1C
Hgb A1c MFr Bld: 5 % of total Hgb (ref <5.7)
Mean Plasma Glucose: 97 (calc)
eAG (mmol/L): 5.4 (calc)

## 2018-11-06 LAB — LIPID PANEL
Cholesterol: 191 mg/dL (ref <200)
HDL: 44 mg/dL (ref >40)
LDL Cholesterol (Calc): 132 mg/dL (calc) — ABNORMAL HIGH
Non-HDL Cholesterol (Calc): 147 mg/dL (calc) — ABNORMAL HIGH (ref <130)
Total CHOL/HDL Ratio: 4.3 (calc) (ref <5.0)
Triglycerides: 59 mg/dL (ref <150)

## 2018-11-06 LAB — TSH: TSH: 0.98 mIU/L (ref 0.40–4.50)

## 2019-11-06 ENCOUNTER — Encounter: Payer: Commercial Managed Care - PPO | Admitting: Nurse Practitioner

## 2020-09-01 IMAGING — US US SCROTUM W/ DOPPLER COMPLETE
1 series · 14 of 25 positions shown · non-contrast
Comparison: None.

CLINICAL DATA: Left epididymal mass.

EXAM:
SCROTAL ULTRASOUND
DOPPLER ULTRASOUND OF THE TESTICLES
TECHNIQUE: Complete ultrasound examination of the testicles, epididymis, and
other scrotal structures was performed. Color and spectral Doppler
ultrasound were also utilized to evaluate blood flow to the
testicles.

[Series 1: us scrotum w/ doppler complete · 0.08mm/px · 14 of 56 slices shown]
[im 1/56]
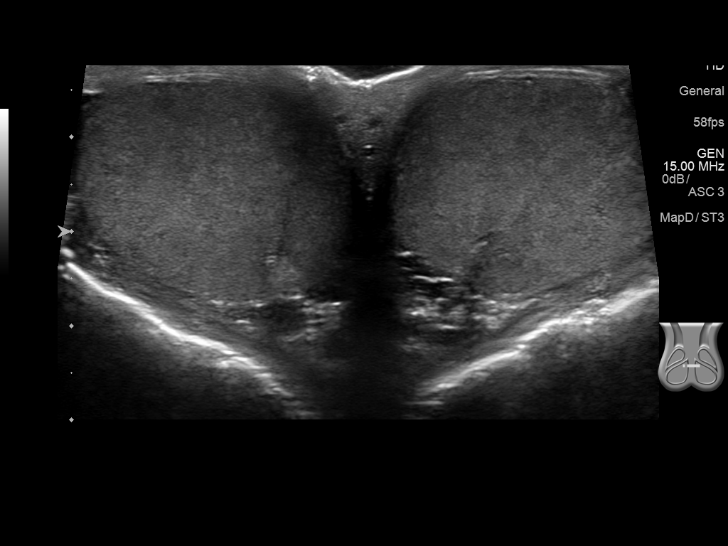
[im 5/56]
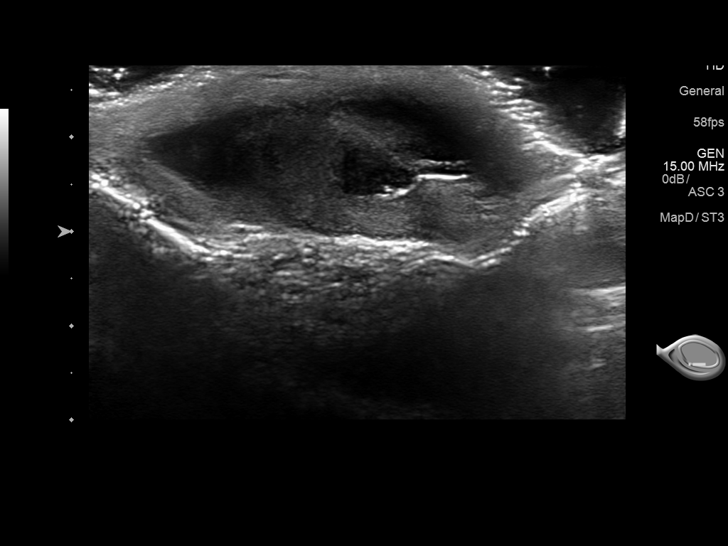
[im 10/56]
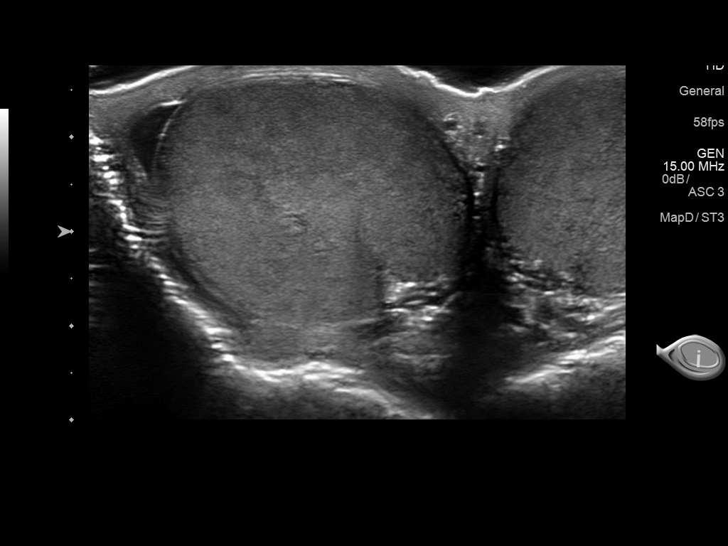
[im 14/56]
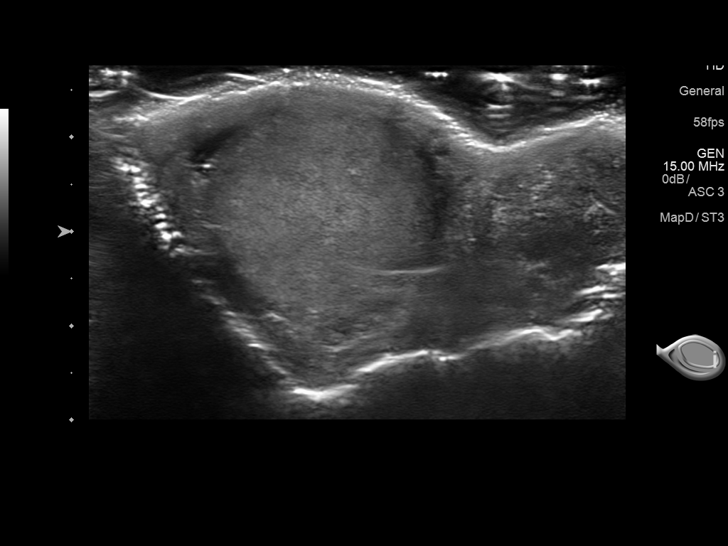
[im 19/56]
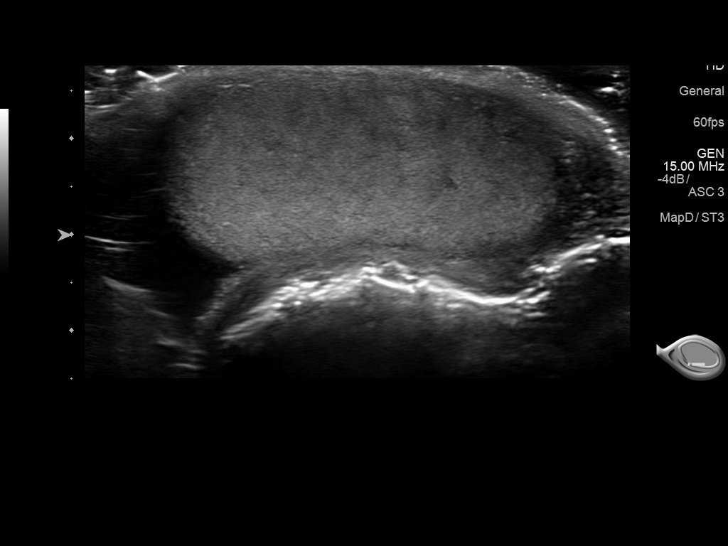
[im 21/56]
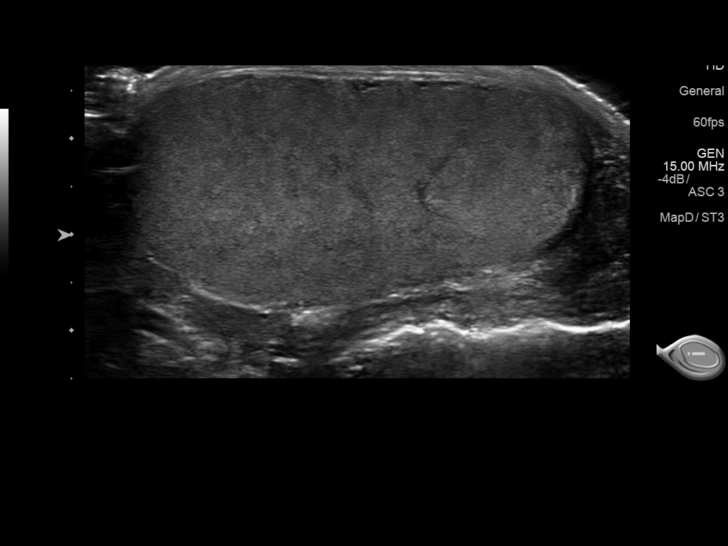
[im 26/56]
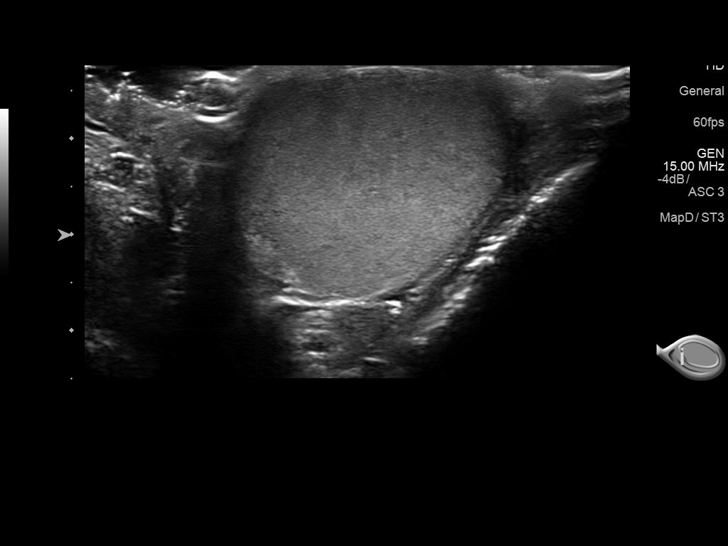
[im 30/56]
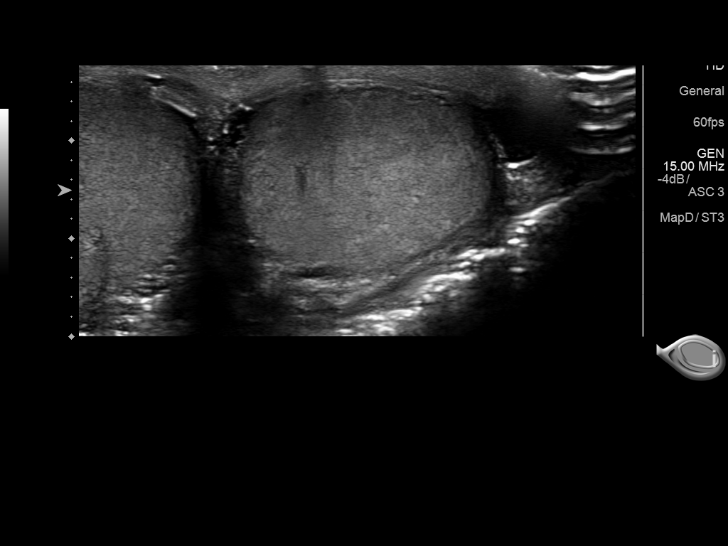
[im 35/56]
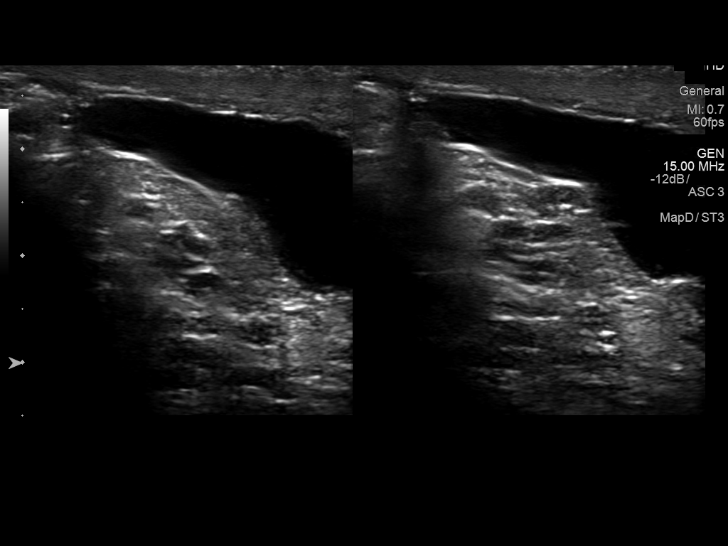
[im 37/56]
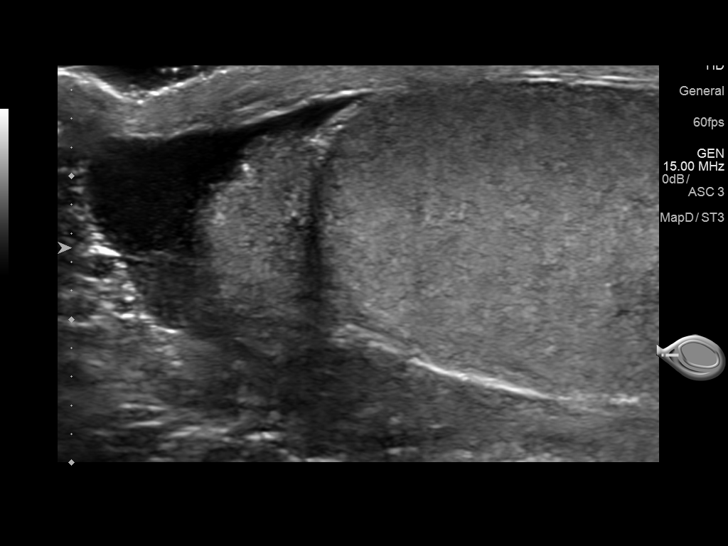
[im 42/56]
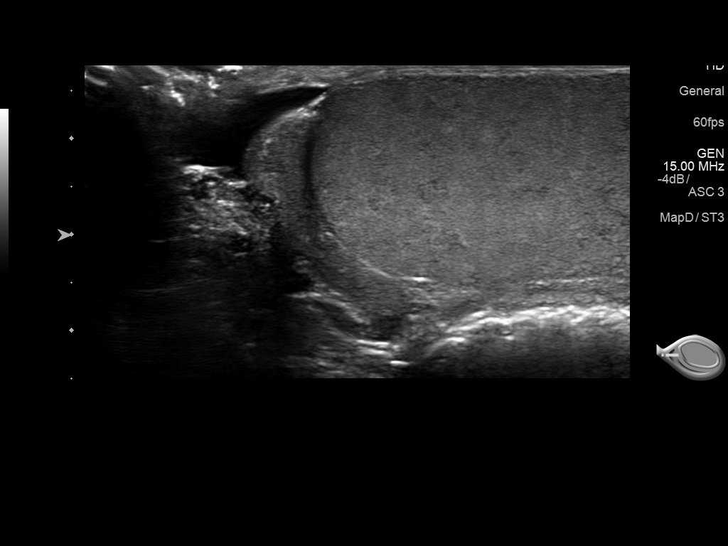
[im 46/56]
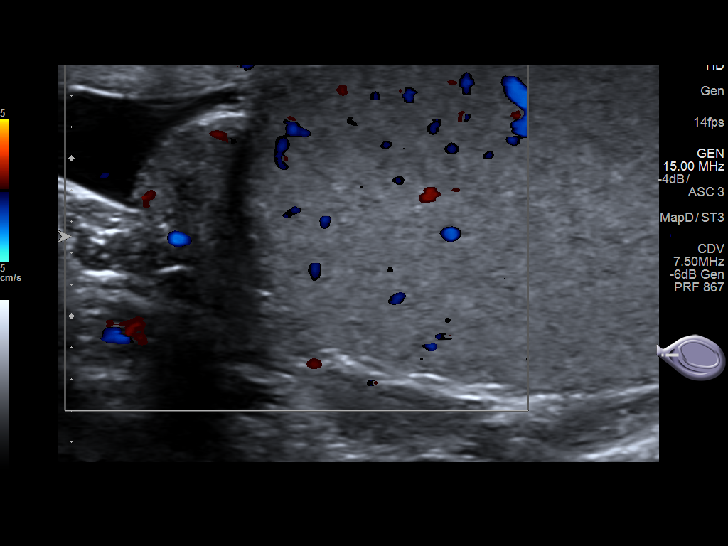
[im 51/56]
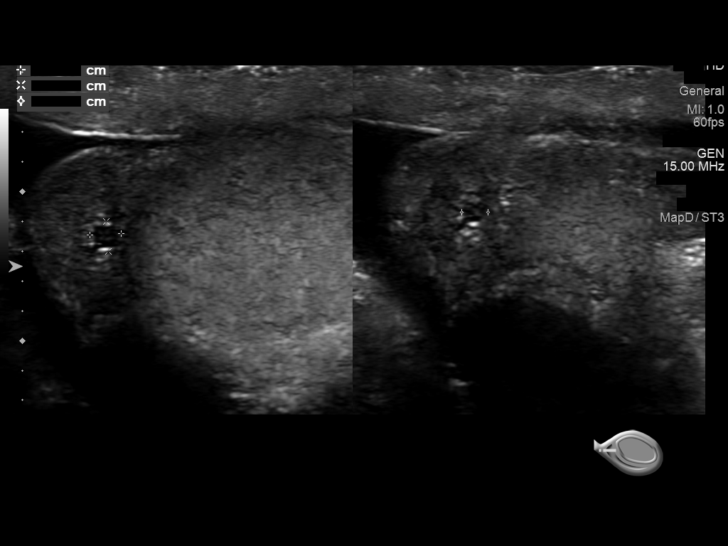
[im 56/56]
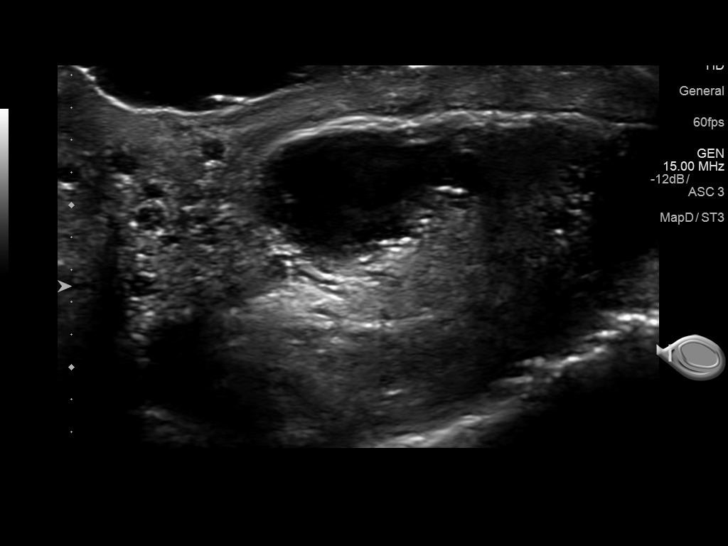

[14 of 25 positions shown; findings below may reference images not displayed]

FINDINGS: Right testicle

Measurements: 4.6 x 2.7 x 3.2 cm. No mass or microlithiasis
visualized.

Left testicle

Measurements: 4.7 x 2.6 x 3.2 cm. No mass or microlithiasis
visualized.

Right epididymis:  Normal in size and appearance.

Left epididymis: 2 epididymal cysts are seen, the larger measuring
1.3 x 1.3 x 1.4 cm.

Hydrocele:  None visualized.

Varicocele:  None visualized.

Pulsed Doppler interrogation of both testes demonstrates normal low
resistance arterial and venous waveforms bilaterally.
IMPRESSION: Normal appearance of the testicles.

Two epididymal cysts, the larger measuring 1.4 cm in greatest
dimension, may account for the area of palpable abnormality.

## 2022-05-23 ENCOUNTER — Other Ambulatory Visit: Payer: Self-pay

## 2022-05-23 ENCOUNTER — Emergency Department: Payer: Commercial Managed Care - PPO

## 2022-05-23 ENCOUNTER — Emergency Department
Admission: EM | Admit: 2022-05-23 | Discharge: 2022-05-23 | Disposition: A | Discharge status: Home / Self Care | Payer: Commercial Managed Care - PPO | Attending: Emergency Medicine | Admitting: Emergency Medicine

## 2022-05-23 DIAGNOSIS — R1013 Epigastric pain: Secondary | ICD-10-CM | POA: Diagnosis not present

## 2022-05-23 DIAGNOSIS — R079 Chest pain, unspecified: Secondary | ICD-10-CM

## 2022-05-23 DIAGNOSIS — R0789 Other chest pain: Secondary | ICD-10-CM | POA: Insufficient documentation

## 2022-05-23 LAB — COMPREHENSIVE METABOLIC PANEL
ALT: 76 U/L — ABNORMAL HIGH (ref 0–44)
AST: 27 U/L (ref 15–41)
Albumin: 4.7 g/dL (ref 3.5–5.0)
Alkaline Phosphatase: 57 U/L (ref 38–126)
Anion gap: 8 (ref 5–15)
BUN: 15 mg/dL (ref 6–20)
CO2: 24 mmol/L (ref 22–32)
Calcium: 9.2 mg/dL (ref 8.9–10.3)
Chloride: 106 mmol/L (ref 98–111)
Creatinine, Ser: 0.92 mg/dL (ref 0.61–1.24)
GFR, Estimated: >60 mL/min (ref >60)
Glucose, Bld: 166 mg/dL — ABNORMAL HIGH (ref 70–99)
Potassium: 4 mmol/L (ref 3.5–5.1)
Sodium: 138 mmol/L (ref 135–145)
Total Bilirubin: 0.6 mg/dL (ref 0.3–1.2)
Total Protein: 7.6 g/dL (ref 6.5–8.1)

## 2022-05-23 LAB — URINALYSIS, ROUTINE W REFLEX MICROSCOPIC
Bilirubin Urine: NEGATIVE
Glucose, UA: NEGATIVE mg/dL
Hgb urine dipstick: NEGATIVE
Ketones, ur: NEGATIVE mg/dL
Nitrite: NEGATIVE
Protein, ur: NEGATIVE mg/dL
Specific Gravity, Urine: 1.013 (ref 1.005–1.030)
Squamous Epithelial / HPF: NONE SEEN (ref 0–5)
pH: 6 (ref 5.0–8.0)

## 2022-05-23 LAB — CBC
HCT: 44.2 % (ref 39.0–52.0)
Hemoglobin: 14.9 g/dL (ref 13.0–17.0)
MCH: 28.4 pg (ref 26.0–34.0)
MCHC: 33.7 g/dL (ref 30.0–36.0)
MCV: 84.2 fL (ref 80.0–100.0)
Platelets: 246 10*3/uL (ref 150–400)
RBC: 5.25 MIL/uL (ref 4.22–5.81)
RDW: 12.6 % (ref 11.5–15.5)
WBC: 8.6 10*3/uL (ref 4.0–10.5)
nRBC: 0 % (ref 0.0–0.2)

## 2022-05-23 LAB — TROPONIN I (HIGH SENSITIVITY)
Troponin I (High Sensitivity): 3 ng/L (ref <18)
Troponin I (High Sensitivity): 4 ng/L (ref <18)

## 2022-05-23 LAB — LIPASE, BLOOD: Lipase: 40 U/L (ref 11–51)

## 2022-05-23 MED ORDER — SUCRALFATE 1 G PO TABS: 1.0000 g | ORAL_TABLET | Freq: Four times a day (QID) | ORAL | 1 refills | Status: DC

## 2022-05-23 MED ORDER — LIDOCAINE VISCOUS HCL 2 % MT SOLN
15.0000 mL | Freq: Once | OROMUCOSAL | Status: AC
  Administered 2022-05-23: 15 mL via ORAL
  Filled 2022-05-23: qty 15

## 2022-05-23 MED ORDER — ALUM & MAG HYDROXIDE-SIMETH 200-200-20 MG/5ML PO SUSP
30.0000 mL | Freq: Once | ORAL | Status: AC
  Administered 2022-05-23: 30 mL via ORAL
  Filled 2022-05-23: qty 30

## 2022-05-23 MED ORDER — IOHEXOL 300 MG/ML  SOLN
100.0000 mL | Freq: Once | INTRAMUSCULAR | Status: AC | PRN
  Administered 2022-05-23: 100 mL via INTRAVENOUS

## 2022-05-23 MED ORDER — PANTOPRAZOLE SODIUM 40 MG PO TBEC: 40.0000 mg | DELAYED_RELEASE_TABLET | Freq: Every day | ORAL | 1 refills | Status: DC

## 2022-05-23 MED ORDER — HYDROCODONE-ACETAMINOPHEN 5-325 MG PO TABS: 1.0000 | ORAL_TABLET | ORAL | 0 refills | Status: DC | PRN

## 2022-05-23 MED ORDER — MORPHINE SULFATE (PF) 4 MG/ML IV SOLN
4.0000 mg | Freq: Once | INTRAVENOUS | Status: AC
  Administered 2022-05-23: 4 mg via INTRAVENOUS
  Filled 2022-05-23: qty 1

## 2022-05-23 MED ORDER — ONDANSETRON HCL 4 MG/2ML IJ SOLN
4.0000 mg | Freq: Once | INTRAMUSCULAR | Status: AC
  Administered 2022-05-23: 4 mg via INTRAVENOUS
  Filled 2022-05-23: qty 2

## 2022-05-23 NOTE — Discharge Instructions (Addendum)
Please take your medications as prescribed.  Please follow-up with GI medicine by calling the number provided to arrange a follow-up appointment.  Return to the emergency department for any worsening pain or any other symptom personally concerning to yourself.

## 2022-05-23 NOTE — ED Provider Notes (Signed)
[facility] Provider Note    Event Date/Time   First MD Initiated Contact with Patient 05/23/22 1152     (approximate)  History   Chief Complaint: Chest Pain  HPI  Carlos Davis is a 41 y.o. male with a past medical history of pancreatitis presents to the emergency department for chest pain.  According to the patient last night around 9 PM he developed pain in the center of her chest.  Patient states a history of reflux previously tried taking Zantac without relief.  Patient states he was having trouble sleeping due to the discomfort.  This morning he then checked his blood pressure and it was elevated so he came to the emergency department for evaluation.  Denies any shortness of breath denies any diaphoresis.  Patient does admit to alcohol use last night.  Physical Exam   Triage Vital Signs: ED Triage Vitals  Enc Vitals Group     BP 05/23/22 1105 (!) 163/105     Pulse Rate 05/23/22 1105 63     Resp 05/23/22 1105 18     Temp 05/23/22 1105 98.6 F (37 C)     Temp Source 05/23/22 1105 Oral     SpO2 05/23/22 1105 98 %     Weight 05/23/22 1103 200 lb (90.7 kg)     Height 05/23/22 1103 5\' 5"  (1.651 m)     Head Circumference --      Peak Flow --      Pain Score 05/23/22 1103 6     Pain Loc --      Pain Edu? --      Excl. in GC? --     Most recent vital signs: Vitals:   05/23/22 1105  BP: (!) 163/105  Pulse: 63  Resp: 18  Temp: 98.6 F (37 C)  SpO2: 98%    General: Awake, no distress.  CV:  Good peripheral perfusion.  Regular rate and rhythm  Resp:  Normal effort.  Equal breath sounds bilaterally.  Abd:  No distention.  Soft, nontender.  No rebound or guarding.  Benign abdomen.   ED Results / Procedures / Treatments   EKG  EKG viewed and interpreted by myself shows a normal sinus rhythm at 64 bpm with a narrow QRS, normal axis, normal intervals, no concerning ST changes.  RADIOLOGY  I have reviewed and interpreted the chest x-ray  images I do not see any acute abnormality on my evaluation. Radiology is read the chest x-ray is negative CT scan is negative for acute abnormality   MEDICATIONS ORDERED IN ED: Medications  alum & mag hydroxide-simeth (MAALOX/MYLANTA) 200-200-20 MG/5ML suspension 30 mL (30 mLs Oral Given 05/23/22 1213)    And  lidocaine (XYLOCAINE) 2 % viscous mouth solution 15 mL (15 mLs Oral Given 05/23/22 1213)     IMPRESSION / MDM / ASSESSMENT AND PLAN / ED COURSE  I reviewed the triage vital signs and the nursing notes.  Patient's presentation is most consistent with acute presentation with potential threat to life or bodily function.  Patient presents to the emergency department for chest discomfort since last night.  Differential would include gastric reflux/esophagitis or gastritis, ACS, pneumonia or pneumothorax.  We will check labs we will obtain an x-ray we will continue to closely monitor.  Patient's labs are largely within normal limits including a normal/negative troponin normal lipase normal chemistry including LFTs and normal CBC.  EKG is reassuring.  Patient continues to have some discomfort despite GI cocktail.  Given the patient's history of pancreatitis with epigastric/lower chest pain we will obtain CT imaging of the abdomen to rule out acute pancreatitis or other abnormality.  CT scan is negative.  Patient is feeling somewhat better.  We will discharge with short course of pain medication we will also place patient on Protonix and sucralfate have the patient follow-up with his doctor.  I discussed return precautions.  Patient agreeable to plan of care.  FINAL CLINICAL IMPRESSION(S) / ED DIAGNOSES   Chest pain Epigastric pain  Note:  This document was prepared using Dragon voice recognition software and may include unintentional dictation errors.   Minna Antis, MD 05/23/22 1435

## 2022-05-23 NOTE — ED Triage Notes (Signed)
Pt in with co chest pain since 2100 yest. States was not able to sleep due to discomfort, states feels like burning to chest. Pt also checked bp and it was elevated, no hx of the same. Took otc zantac for the same without relief.

## 2022-10-26 NOTE — Progress Notes (Deleted)
New patient visit   Patient: Carlos Davis   DOB: 18-Jun-1981   41 y.o. Male  MRN: [id number] Visit Date: 10/30/2022  Today's healthcare provider: Alfredia Ferguson, PA-C   No chief complaint on file.  Subjective    Carlos Davis is a 41 y.o. male who presents today as a new patient to establish care.  HPI  ***  Past Medical History:  Diagnosis Date   Pancreatitis    Past Surgical History:  Procedure Laterality Date   CHOLECYSTECTOMY     CHOLECYSTECTOMY  2002   Family Status  Relation Name Status   MGM  (Not Specified)   PGM  (Not Specified)   Mother  Alive   Father  Alive   Other half-bro - Mat (Not Specified)   Daughter  (Not Specified)   MGF  (Not Specified)   PGF  (Not Specified)   Other nephew Alive   Family History  Problem Relation Age of Onset   Diabetes Maternal Grandmother    Diabetes Paternal Grandmother    Charcot-Marie-Tooth disease Mother    Arthritis Father    High blood pressure Father    Charcot-Marie-Tooth disease Other    Other Daughter        Hydronephrosis (intrauterine)   Colon cancer Maternal Grandfather        onset 19s   Heart attack Maternal Grandfather    Aneurysm Paternal Grandfather        Brain, sudden death   Charcot-Marie-Tooth disease Other    Social History   Socioeconomic History   Marital status: Married    Spouse name: Not on file   Number of children: 1   Years of education: Not on file   Highest education level: GED or equivalent  Occupational History   Not on file  Tobacco Use   Smoking status: Every Day    Packs/day: 0.10    Types: Cigarettes   Smokeless tobacco: Current    Types: Snuff   Tobacco comments:    uses 1 can dip per day, 1 pack cigs per month  Vaping Use   Vaping Use: Never used  Substance and Sexual Activity   Alcohol use: Yes    Alcohol/week: 12.0 standard drinks of alcohol    Types: 12 Cans of beer per week    Comment: weekly   Drug use: Not Currently    Comment: > 18 years ago,  used hallucinogenics x 5 years   Sexual activity: Yes    Comment: Partner - IUD  Other Topics Concern   Not on file  Social History Narrative   Not on file   Social Determinants of Health   Financial Resource Strain: Not on file  Food Insecurity: Not on file  Transportation Needs: Not on file  Physical Activity: Not on file  Stress: Not on file  Social Connections: Not on file   Outpatient Medications Prior to Visit  Medication Sig   HYDROcodone-acetaminophen (NORCO/VICODIN) 5-325 MG tablet Take 1 tablet by mouth every 4 (four) hours as needed.   pantoprazole (PROTONIX) 40 MG tablet Take 1 tablet (40 mg total) by mouth daily.   sucralfate (CARAFATE) 1 g tablet Take 1 tablet (1 g total) by mouth 4 (four) times daily for 15 days.   No facility-administered medications prior to visit.   No Known Allergies  There is no immunization history for the selected administration types on file for this patient.  Health Maintenance  Topic Date Due   COVID-19 Vaccine (1) Never  done   HIV Screening  Never done   Hepatitis C Screening  Never done   DTaP/Tdap/Td (1 - Tdap) Never done   INFLUENZA VACCINE  Never done   HPV VACCINES  Aged Out    Patient Care Team: Galen Manila, NP (Inactive) as PCP - General (Nurse Practitioner)  Review of Systems  {Labs  Heme  Chem  Endocrine  Serology  Results Review (optional):23779}   Objective    There were no vitals taken for this visit. {Show previous vital signs (optional):23777}  Physical Exam ***  Depression Screen    07/31/2018    2:23 PM  PHQ 2/9 Scores  PHQ - 2 Score 0  PHQ- 9 Score 1   No results found for any visits on 10/30/22.  Assessment & Plan     ***  No follow-ups on file.     {provider attestation***:1}   Alfredia Ferguson, PA-C  [facility] [phone] (phone) [phone] (fax)  [facility]

## 2022-10-30 ENCOUNTER — Ambulatory Visit: Payer: Commercial Managed Care - PPO | Admitting: Physician Assistant

## 2022-11-07 ENCOUNTER — Ambulatory Visit (INDEPENDENT_AMBULATORY_CARE_PROVIDER_SITE_OTHER): Payer: Commercial Managed Care - PPO | Admitting: Physician Assistant

## 2022-11-07 ENCOUNTER — Encounter: Payer: Self-pay | Admitting: Physician Assistant

## 2022-11-07 VITALS — BP 123/79 | HR 87 | Ht 65.0 in | Wt 212.5 lb

## 2022-11-07 DIAGNOSIS — R739 Hyperglycemia, unspecified: Secondary | ICD-10-CM | POA: Diagnosis not present

## 2022-11-07 DIAGNOSIS — E785 Hyperlipidemia, unspecified: Secondary | ICD-10-CM

## 2022-11-07 DIAGNOSIS — K219 Gastro-esophageal reflux disease without esophagitis: Secondary | ICD-10-CM

## 2022-11-07 DIAGNOSIS — L299 Pruritus, unspecified: Secondary | ICD-10-CM

## 2022-11-07 NOTE — Assessment & Plan Note (Signed)
Historically, unmedicated. Will repeat fasting lipids

## 2022-11-07 NOTE — Assessment & Plan Note (Signed)
B/l feet Discussed trying antifungal,  Discussed hydrating lotions,  pt deferred treatment for now

## 2022-11-07 NOTE — Assessment & Plan Note (Signed)
Historically, fhx of diabetes Will check a1c

## 2022-11-07 NOTE — Assessment & Plan Note (Signed)
Advised pantoprazole for GERD should be taken for longer periods of time, 4-6 weeks to work the most effectively.  For PRN symptoms, tums or pepcid otc

## 2022-11-07 NOTE — Progress Notes (Signed)
I,Carlos Davis,acting as a Education administrator for [employer], PA-C.,have documented all relevant documentation on the behalf of Carlos Kirschner, PA-C,as directed by  Carlos Kirschner, PA-C while in the presence of Carlos Kirschner, PA-C.   New patient visit   Patient: Carlos Davis   DOB: [birth year]   42 y.o. Male  MRN: [id number] Visit Date: 11/07/2022  Today's healthcare provider: Mikey Kirschner, PA-C   Cc. New patient establish care  Subjective    Carlos Davis is a 42 y.o. male who presents today as a new patient to establish care.   He reports his only recent medical issue has been PRN acid reflux. He has taken pantoprazole and carafate intermittently in the past.  Reports history of diverticulitis, treated and has not recurred.  Pt reports itching in his feet intermittently. Reports history of bone spurs but denies pain today.  Reports he thinks his daughter has Carlos Davis disease, and he has been told the gene runs in himself and his family. Denies history of genetic testing. Denies any issues with muscular weakness, gait abnormalities. Pt reports he has high arches.  Past Medical History:  Diagnosis Date   Diverticulitis    Heartburn    Pancreatitis 2005   Pancreatitis    Past Surgical History:  Procedure Laterality Date   CHOLECYSTECTOMY     CHOLECYSTECTOMY  2002   Family Status  Relation Name Status   Mother  Alive   Father  Alive   Daughter  (Not Specified)   Mat Uncle  (Not Specified)   MGM  (Not Specified)   MGF  (Not Specified)   PGM  (Not Specified)   PGF  (Not Specified)   Other half-bro - Mat (Not Specified)   Other nephew Alive   Family History  Problem Relation Age of Onset   Carlos-Marie-Davis disease Mother    Arthritis Father    High blood pressure Father    Other Daughter        Hydronephrosis (intrauterine)   Colon cancer Maternal Uncle 71   Diabetes Maternal Grandmother    Colon cancer Maternal Grandfather 43 - 49   Heart  attack Maternal Grandfather 16 - 49   Diabetes Paternal Grandmother    Aneurysm Paternal Grandfather 36 - 66       Brain, sudden death   Carlos-Marie-Davis disease Other    Carlos-Marie-Davis disease Other    Social History   Socioeconomic History   Marital status: Married    Spouse name: Not on file   Number of children: 1   Years of education: Not on file   Highest education level: GED or equivalent  Occupational History   Not on file  Tobacco Use   Smoking status: Every Day    Packs/day: 0.10    Types: Cigarettes   Smokeless tobacco: Current    Types: Snuff   Tobacco comments:      Smokes sporadically 1 pck / month sometimes  Vaping Use   Vaping Use: Never used  Substance and Sexual Activity   Alcohol use: Yes    Alcohol/week: 12.0 standard drinks of alcohol    Types: 12 Cans of beer per week    Comment: weekly   Drug use: Not Currently    Comment: > 18 years ago, used hallucinogenics x 5 years   Sexual activity: Yes    Comment: Partner - IUD  Other Topics Concern   Not on file  Social History Narrative   Not on file   Social  Determinants of Health   Financial Resource Strain: Not on file  Food Insecurity: Not on file  Transportation Needs: Not on file  Physical Activity: Not on file  Stress: Not on file  Social Connections: Not on file   Outpatient Medications Prior to Visit  Medication Sig   pantoprazole (PROTONIX) 40 MG tablet Take 1 tablet (40 mg total) by mouth daily.   sucralfate (CARAFATE) 1 g tablet Take 1 tablet (1 g total) by mouth 4 (four) times daily for 15 days.   [DISCONTINUED] HYDROcodone-acetaminophen (NORCO/VICODIN) 5-325 MG tablet Take 1 tablet by mouth every 4 (four) hours as needed.   No facility-administered medications prior to visit.   No Known Allergies  There is no immunization history for the selected administration types on file for this patient.  Health Maintenance  Topic Date Due   DTaP/Tdap/Td (1 - Tdap) Never done    COVID-19 Vaccine (1) 11/23/2022 (Originally 06/19/1981)   INFLUENZA VACCINE  01/27/2023 (Originally 05/29/2022)   Hepatitis C Screening  11/08/2023 (Originally 12/20/1998)   HIV Screening  11/08/2023 (Originally 12/21/1995)   HPV VACCINES  Aged Out    Patient Care Team: Carlos Kirschner, PA-C as PCP - General (Physician Assistant)  Review of Systems  Constitutional:  Negative for fatigue and fever.  Respiratory:  Negative for cough and shortness of breath.   Cardiovascular:  Negative for chest pain, palpitations and leg swelling.  Neurological:  Negative for dizziness and headaches.     Objective    Blood pressure 123/79, pulse 87, height 5\' 5"  (1.651 m), weight 212 lb 8 oz (96.4 kg), SpO2 100 %.   Physical Exam Constitutional:      General: He is awake.     Appearance: He is well-developed.  HENT:     Head: Normocephalic.  Eyes:     Conjunctiva/sclera: Conjunctivae normal.  Cardiovascular:     Rate and Rhythm: Normal rate and regular rhythm.     Heart sounds: Normal heart sounds.  Pulmonary:     Effort: Pulmonary effort is normal.     Breath sounds: Normal breath sounds.  Skin:    General: Skin is warm.  Neurological:     Mental Status: He is alert and oriented to person, place, and time.  Psychiatric:        Attention and Perception: Attention normal.        Mood and Affect: Mood normal.        Speech: Speech normal.        Behavior: Behavior is cooperative.     Depression Screen    11/07/2022    2:00 PM 07/31/2018    2:23 PM  PHQ 2/9 Scores  PHQ - 2 Score 0 0  PHQ- 9 Score 0 1   No results found for any visits on 11/07/22.  Assessment & Plan      Problem List Items Addressed This Visit       Digestive   Gastroesophageal reflux disease - Primary    Advised pantoprazole for GERD should be taken for longer periods of time, 4-6 weeks to work the most effectively.  For PRN symptoms, tums or pepcid otc        Other   Itching    B/l feet Discussed trying  antifungal,  Discussed hydrating lotions,  pt deferred treatment for now      Hyperglycemia    Historically, fhx of diabetes Will check a1c      Relevant Orders   HgB A1c   Comprehensive  Metabolic Panel (CMET)   Hyperlipidemia    Historically, unmedicated. Will repeat fasting lipids        Relevant Orders   Comprehensive Metabolic Panel (CMET)   Lipid panel   Discussed genetic testing for CMT disease but pt feels well and has no need to find out.   Return in about 1 year (around 11/08/2023) for CPE.    I, Alfredia Ferguson, PA-C have reviewed all documentation for this visit. The documentation on  11/07/22 for the exam, diagnosis, procedures, and orders are all accurate and complete.  Alfredia Ferguson, PA-C [facility] [address] [place], [place], [address] Office: [phone] Fax: [phone]   [facility]

## 2022-11-09 LAB — LIPID PANEL
Chol/HDL Ratio: 6.4 ratio — ABNORMAL HIGH (ref 0.0–5.0)
Cholesterol, Total: 235 mg/dL — ABNORMAL HIGH (ref 100–199)
HDL: 37 mg/dL — ABNORMAL LOW (ref >39)
LDL Chol Calc (NIH): 162 mg/dL — ABNORMAL HIGH (ref 0–99)
Triglycerides: 197 mg/dL — ABNORMAL HIGH (ref 0–149)
VLDL Cholesterol Cal: 36 mg/dL (ref 5–40)

## 2022-11-09 LAB — COMPREHENSIVE METABOLIC PANEL
ALT: 72 IU/L — ABNORMAL HIGH (ref 0–44)
AST: 23 IU/L (ref 0–40)
Albumin/Globulin Ratio: 2.1 (ref 1.2–2.2)
Albumin: 4.7 g/dL (ref 4.1–5.1)
Alkaline Phosphatase: 63 IU/L (ref 44–121)
BUN/Creatinine Ratio: 13 (ref 9–20)
BUN: 13 mg/dL (ref 6–24)
Bilirubin Total: 0.3 mg/dL (ref 0.0–1.2)
CO2: 23 mmol/L (ref 20–29)
Calcium: 9.8 mg/dL (ref 8.7–10.2)
Chloride: 105 mmol/L (ref 96–106)
Creatinine, Ser: 0.97 mg/dL (ref 0.76–1.27)
Globulin, Total: 2.2 g/dL (ref 1.5–4.5)
Glucose: 103 mg/dL — ABNORMAL HIGH (ref 70–99)
Potassium: 4.8 mmol/L (ref 3.5–5.2)
Sodium: 142 mmol/L (ref 134–144)
Total Protein: 6.9 g/dL (ref 6.0–8.5)
eGFR: 101 mL/min/{1.73_m2} (ref >59)

## 2022-11-09 LAB — SPECIMEN STATUS REPORT

## 2022-11-09 LAB — HEMOGLOBIN A1C
Est. average glucose Bld gHb Est-mCnc: 117 mg/dL
Hgb A1c MFr Bld: 5.7 % — ABNORMAL HIGH (ref 4.8–5.6)

## 2023-04-26 ENCOUNTER — Ambulatory Visit
Admission: RE | Admit: 2023-04-26 | Discharge: 2023-04-26 | Disposition: A | Discharge status: Home / Self Care | Payer: Commercial Managed Care - PPO | Source: Ambulatory Visit | Attending: Physician Assistant | Admitting: Physician Assistant

## 2023-04-26 ENCOUNTER — Ambulatory Visit (INDEPENDENT_AMBULATORY_CARE_PROVIDER_SITE_OTHER): Payer: Commercial Managed Care - PPO | Admitting: Physician Assistant

## 2023-04-26 ENCOUNTER — Encounter: Payer: Self-pay | Admitting: Physician Assistant

## 2023-04-26 ENCOUNTER — Other Ambulatory Visit: Payer: Self-pay | Admitting: Physician Assistant

## 2023-04-26 VITALS — BP 132/92 | HR 83 | Ht 65.0 in | Wt 197.1 lb

## 2023-04-26 DIAGNOSIS — N503 Cyst of epididymis: Secondary | ICD-10-CM

## 2023-04-26 DIAGNOSIS — N50812 Left testicular pain: Secondary | ICD-10-CM

## 2023-04-26 LAB — POCT URINALYSIS DIPSTICK
Bilirubin, UA: NEGATIVE
Glucose, UA: NEGATIVE
Ketones, UA: NEGATIVE
Leukocytes, UA: NEGATIVE
Nitrite, UA: NEGATIVE
Protein, UA: NEGATIVE
Spec Grav, UA: 1.02 (ref 1.010–1.025)
Urobilinogen, UA: 0.2 E.U./dL
pH, UA: 6 (ref 5.0–8.0)

## 2023-04-26 NOTE — Progress Notes (Signed)
I,Sha'taria Tyson,acting as a Neurosurgeon for [employer], PA-C.,have documented all relevant documentation on the behalf of Alfredia Ferguson, PA-C,as directed by  Alfredia Ferguson, PA-C while in the presence of Alfredia Ferguson, PA-C.   Established patient visit   Patient: Carlos Davis   DOB: 1981-03-20   42 y.o. Male  MRN: [id number] Visit Date: 04/26/2023  Today's healthcare provider: Alfredia Ferguson, PA-C   Chief Complaint  Patient presents with   Medical Management of Chronic Issues   Subjective    HPI  Discussed the use of AI scribe software for clinical note transcription with the patient, who gave verbal consent to proceed.  History of Present Illness   The patient presents with a two-week history of intermittent sharp penile pain, urethral irritation, and worsening testicular pain and swelling. The pain and swelling are predominantly on the left side, where he notes a mass that is 'three times bigger than it used to be.' The pain and swelling worsen as the day progresses, and he has noticed that the 'veins or tubes' around the testicle are inflamed. He also reports a change in the appearance of their penis, which has become retracted, or tucked in. The patient denies any changes in urination or any urinary symptoms. He has tested negative for sexually transmitted diseases, received results this week.      Medications: Outpatient Medications Prior to Visit  Medication Sig   pantoprazole (PROTONIX) 40 MG tablet Take 1 tablet (40 mg total) by mouth daily. (Patient not taking: Reported on 04/26/2023)   sucralfate (CARAFATE) 1 g tablet Take 1 tablet (1 g total) by mouth 4 (four) times daily for 15 days.   No facility-administered medications prior to visit.    Review of Systems  Constitutional:  Negative for fatigue and fever.  Respiratory:  Negative for cough and shortness of breath.   Cardiovascular:  Negative for chest pain, palpitations and leg swelling.  Neurological:   Negative for dizziness and headaches.      Objective    BP (!) 132/92 (BP Location: Right Arm, Patient Position: Sitting, Cuff Size: Normal)   Pulse 83   Ht 5\' 5"  (1.651 m)   Wt 197 lb 1.6 oz (89.4 kg)   SpO2 99%   BMI 32.80 kg/m   Physical Exam Vitals reviewed.  Constitutional:      Appearance: He is not ill-appearing.  HENT:     Head: Normocephalic.  Eyes:     Conjunctiva/sclera: Conjunctivae normal.  Cardiovascular:     Rate and Rhythm: Normal rate.  Pulmonary:     Effort: Pulmonary effort is normal. No respiratory distress.  Genitourinary:    Comments: Left scrotum is slightly larger than right. Bilaterally there is firmness, with a palpable mass in the left testicle No visual abnormalities of the penis. Neurological:     General: No focal deficit present.     Mental Status: He is alert and oriented to person, place, and time.  Psychiatric:        Mood and Affect: Mood normal.        Behavior: Behavior normal.     Results for orders placed or performed in visit on 04/26/23  POCT Urinalysis Dipstick  Result Value Ref Range   Color, UA     Clarity, UA     Glucose, UA Negative Negative   Bilirubin, UA negative    Ketones, UA negative    Spec Grav, UA 1.020 1.010 - 1.025   Blood, UA trace  pH, UA 6.0 5.0 - 8.0   Protein, UA Negative Negative   Urobilinogen, UA 0.2 0.2 or 1.0 E.U./dL   Nitrite, UA negative    Leukocytes, UA Negative Negative   Appearance     Odor      Assessment & Plan     Assessment and Plan    Scrotal Mass/left testicular pain: Increased size and pain in the left testicle over the past two weeks.  [place] 2019 demonstrated two cysts, largest 1.5 cm. -Order a stat ultrasound of the scrotum to assess the current size and characteristics of the mass. -poc UA, trace blood, sending for culture.      Reviewed STI testing, neg g/c  Return if symptoms worsen or fail to improve.      I, Alfredia Ferguson, PA-C have reviewed all documentation for  this visit. The documentation on  04/26/23   for the exam, diagnosis, procedures, and orders are all accurate and complete.  Alfredia Ferguson, PA-C [facility] [address] [place], [place], [address] Office: [phone] Fax: [phone]   [facility]

## 2023-04-26 NOTE — Addendum Note (Signed)
Addended byAlfredia Ferguson on: 04/26/2023 11:52 AM   Modules accepted: Orders

## 2023-04-30 ENCOUNTER — Encounter: Payer: Self-pay | Admitting: Physician Assistant

## 2023-04-30 LAB — URINE CULTURE

## 2023-05-06 ENCOUNTER — Other Ambulatory Visit: Payer: Self-pay

## 2023-05-06 DIAGNOSIS — N50819 Testicular pain, unspecified: Secondary | ICD-10-CM

## 2023-05-07 ENCOUNTER — Encounter: Payer: Self-pay | Admitting: Urology

## 2023-05-07 ENCOUNTER — Ambulatory Visit (INDEPENDENT_AMBULATORY_CARE_PROVIDER_SITE_OTHER): Payer: Commercial Managed Care - PPO | Admitting: Urology

## 2023-05-07 DIAGNOSIS — R102 Pelvic and perineal pain: Secondary | ICD-10-CM | POA: Diagnosis not present

## 2023-05-07 DIAGNOSIS — N503 Cyst of epididymis: Secondary | ICD-10-CM | POA: Diagnosis not present

## 2023-05-07 DIAGNOSIS — R399 Unspecified symptoms and signs involving the genitourinary system: Secondary | ICD-10-CM | POA: Diagnosis not present

## 2023-05-07 MED ORDER — CELECOXIB 200 MG PO CAPS: 200.0000 mg | ORAL_CAPSULE | Freq: Two times a day (BID) | ORAL | 0 refills | Status: DC

## 2023-05-07 NOTE — Patient Instructions (Addendum)
Urethritis, Adult  Urethritis is a swelling (inflammation) of the urethra. The urethra is the tube that drains urine from the bladder. It is important to get treatment for this condition early. Delayed treatment may lead to complications, such as an infection in the urinary tract (ureters, kidneys, and bladder). What are the causes? This condition may be caused by: Germs that are spread through sexual contact. This is the leading cause of urethritis. This may include bacterial or viral infections. Injury to the urethra. This can happen after a thin, flexible tube (catheter) is inserted into the urethra to drain urine, or after medical instruments or foreign bodies are inserted into the area. Chemical irritation. This may include contact with spermicide or prolonged contact with chemicals in bubble bath, shampoo, or perfumed soaps. A disease that causes inflammation. This is rare. What increases the risk? The following factors may make you more likely to develop this condition: Having sex without using a condom. Having multiple sexual partners. Having poor hygiene. What are the signs or symptoms? Symptoms of this condition include: Pain with urination. Frequent urination. An urgent need to urinate. Itching and pain in the vagina or penis. Discharge or bleeding coming from the penis. Most women have no symptoms. How is this diagnosed? This condition may be diagnosed based on: Your symptoms. Your medical history. A physical exam. Tests may also be done. These may include: Urine tests. Swabs from the urethra. How is this treated? Treatment for this condition depends on the cause. Urethritis caused by a bacterial infection is treated with antibiotic medicine. Sexual partners must also be treated. Follow these instructions at home: Medicines Take over-the-counter and prescription medicines only as told by your health care provider. If you were prescribed an antibiotic, take it as told  by your health care provider. Do not stop taking the antibiotic even if you start to feel better. Lifestyle Avoid using perfumed soaps, bubble bath, and shampoo when you bathe or shower. Rinse the vaginal area after bathing. Wear cotton underwear. Not wearing underwear when going to sleep can help. Make sure to wipe from front to back after using the toilet if you are male. Do not have sex until your health care provider approves. When you do have sex, be sure to practice safe sex. Any sexual partners you have had in the past 60 days should be treated. General instructions Drink enough fluid to keep your urine pale yellow. It is up to you to get your test results. Ask your health care provider, or the department that is doing the test, when your results will be ready. Keep all follow-up visits. This is important. Get tested again 3 months after treatment to make sure the infection is gone. It is important that your sexual partner also gets tested again. Contact a health care provider if: Your symptoms have not improved after 3 days. Your symptoms get worse. You have eye redness or pain. You develop abdominal pain or pelvic pain (in females). You develop joint pain or a rash. You have a fever or chills. Get help right away if: You have severe pain in the belly, back, or side. You vomit repeatedly. Summary Urethritis is a swelling (inflammation) of the urethra. Germs that are spread through sexual contact are the most common cause of this condition. It is important to get treatment for this condition early. Delayed treatment may lead to complications. Treatment for this condition depends on the cause. Any sexual partners must also be treated. This information is not   intended to replace advice given to you by your health care provider. Make sure you discuss any questions you have with your health care provider. Document Revised: 05/22/2020 Document Reviewed: 05/22/2020 Elsevier Patient  Education  2024 [employer]      

## 2023-05-07 NOTE — Progress Notes (Signed)
   05/07/23 2:36 PM   Carlos Davis May 04, 1981 [id number]  CC: Urinary symptoms, scrotal pain, epididymal cyst  HPI: I saw Carlos Davis today for the above issues.  He was seen in 2019 by Michiel Cowboy, PA and found to have a 1.5 cm left epididymal cyst that was benign.  A few weeks ago he noticed some burning with urination that was short and temporary and resolved, he had some dull aching in the scrotum about a week after that has since resolved.  He denies any pain or urinary complaints today.  He also is concerned the penis may be retracting somewhat.  A scrotal ultrasound was ordered by PCP and showed slight enlargement of the left epididymal cyst to 2.4 cm from 1.5 cm 5 years ago.  He denies any gross hematuria.  Dipstick urinalysis was benign, urine culture negative.   PMH: Past Medical History:  Diagnosis Date   Diverticulitis    Heartburn    Pancreatitis 2005   Pancreatitis     Surgical History: Past Surgical History:  Procedure Laterality Date   CHOLECYSTECTOMY     CHOLECYSTECTOMY  2002    Family History: Family History  Problem Relation Age of Onset   Charcot-Marie-Tooth disease Mother    Arthritis Father    High blood pressure Father    Other Daughter        Hydronephrosis (intrauterine)   Colon cancer Maternal Uncle 60   Diabetes Maternal Grandmother    Colon cancer Maternal Grandfather 40 - 49   Heart attack Maternal Grandfather 53 - 49   Diabetes Paternal Grandmother    Aneurysm Paternal Grandfather 24 - 68       Brain, sudden death   Charcot-Marie-Tooth disease Other    Charcot-Marie-Tooth disease Other     Social History:  reports that he quit smoking about 18 months ago. His smoking use included cigarettes. He smoked an average of .1 packs per day. His smokeless tobacco use includes snuff. He reports current alcohol use of about 12.0 standard drinks of alcohol per week. He reports that he does not currently use drugs.  Physical  Exam:  Constitutional:  Alert and oriented, No acute distress. Cardiovascular: No clubbing, cyanosis, or edema. Respiratory: Normal respiratory effort, no increased work of breathing. GI: Abdomen is soft, nontender, nondistended, no abdominal masses GU: Circumcised phallus with patent meatus, no lesions, testicles 20 cc and descended bilaterally without masses, 2 cm left epididymal cyst and superior portion of left scrotum, nontender  Laboratory Data: Reviewed, see HPI  Pertinent Imaging: I have personally viewed and interpreted the scrotal ultrasound showing a benign 2.4 cm left epididymal cyst, no testicular masses, no epididymitis.  Assessment & Plan:   42 year old male with benign 2.4 cm left epididymal cyst, very brief episode of penile pain and scrotal discomfort that is since resolved.  We reviewed possible etiologies including pelvic floor dysfunction, urethritis, epididymitis, idiopathic.  Reassurance was provided regarding benign urinalysis, negative urine culture, and benign scrotal ultrasound and physical exam.  I think it be reasonable to try a week of NSAIDs for his mild discomfort, we discussed the concept of weight gain on penile length/buried penis.    Trial of Celebrex for mild scrotal discomfort Follow-up with urology as needed  Legrand Rams, MD 05/07/2023  [facility] [address], [address] [place], [place] [address] [phone]

## 2023-05-13 ENCOUNTER — Ambulatory Visit: Payer: Commercial Managed Care - PPO | Admitting: Physician Assistant

## 2023-05-14 DIAGNOSIS — R102 Pelvic and perineal pain: Secondary | ICD-10-CM

## 2023-05-14 DIAGNOSIS — N50819 Testicular pain, unspecified: Secondary | ICD-10-CM

## 2023-05-17 MED ORDER — DOXYCYCLINE HYCLATE 100 MG PO CAPS: 100.0000 mg | ORAL_CAPSULE | Freq: Two times a day (BID) | ORAL | 0 refills | Status: DC

## 2023-05-29 ENCOUNTER — Other Ambulatory Visit: Payer: Self-pay

## 2023-05-29 DIAGNOSIS — N50819 Testicular pain, unspecified: Secondary | ICD-10-CM

## 2023-05-31 NOTE — Progress Notes (Unsigned)
06/03/2023 4:44 PM   [name] 1981/06/27 [id number]  Referring provider: Alfredia Ferguson, PA-C [address] [place],  [place] [address]  Urological history: 1. Hydroceles -scrotal US (03/2023) - small bilateral hydroceles  2. Varicocele -scrotal US (03/2023) - right-sided varicocele  3. Epididymal cyst -scrotal US (03/2023) - 2.4 cm left epididymal cyst  Chief Complaint  Patient presents with   Testicle Pain    HPI: Carlos Davis is a 42 y.o.  male who presents today for follow up for scrotal pain.   Previous records reviewed.   He was seen recently by Dr. Richardo Hanks for complaints of short lived dysuria, dull scrotal ache and his penis retracting.  His UA was benign and he was advised to take a short course of Celebrex and explained how increase in weight can result in loss of penile length.    He then he had an elevation in his BP and was advised to take OTC NSAIDS.  He then reached  out to the clinic stating that he was having more pain.  He was started on doxycycline.   He then reached again when his symptoms did not improve.  He also stated the blue/purple spot.    He stated that about four months ago, he felt a quick, sharp pain through the head of  his penis.  He then started to experience scrotal fullness and ache that abates when he is not at work or when he lays down.   He now is experiencing a blue line just below the meatus that worsens when he sits.  This is also uncomfortable when it occurs.  It does not occur when he has an erection.  Patient denies any modifying or aggravating factors.  Patient denies any recent UTI's, gross hematuria, dysuria or suprapubic/flank pain.  Patient denies any fevers, chills, nausea or vomiting.   UA yellow clear, specific gravity 1.015, pH 6.0. trace heme, 0-5 squames, 6-10 RBC's, budding yeast present and granular casts present.    PMH: Past Medical History:  Diagnosis Date   Diverticulitis    Heartburn     Pancreatitis 2005   Pancreatitis     Surgical History: Past Surgical History:  Procedure Laterality Date   CHOLECYSTECTOMY     CHOLECYSTECTOMY  2002    Home Medications:  Allergies as of 06/03/2023   No Known Allergies      Medication List        Accurate as of June 03, 2023  4:44 PM. If you have any questions, ask your nurse or doctor.          STOP taking these medications    celecoxib 200 MG capsule Commonly known as: CeleBREX Stopped by: Alixandrea Milleson   doxycycline 100 MG capsule Commonly known as: VIBRAMYCIN Stopped by: Michiel Cowboy        Allergies: No Known Allergies  Family History: Family History  Problem Relation Age of Onset   Charcot-Marie-Tooth disease Mother    Arthritis Father    High blood pressure Father    Other Daughter        Hydronephrosis (intrauterine)   Colon cancer Maternal Uncle 60   Diabetes Maternal Grandmother    Colon cancer Maternal Grandfather 40 - 49   Heart attack Maternal Grandfather 40 - 49   Diabetes Paternal Grandmother    Aneurysm Paternal Grandfather 23 - 27       Brain, sudden death   Charcot-Marie-Tooth disease Other    Charcot-Marie-Tooth disease Other  Social History:  reports that he quit smoking about 19 months ago. His smoking use included cigarettes. His smokeless tobacco use includes snuff. He reports current alcohol use of about 12.0 standard drinks of alcohol per week. He reports that he does not currently use drugs.  ROS: Pertinent ROS in HPI  Physical Exam: BP (!) 141/91 (BP Location: Left Arm, Patient Position: Sitting, Cuff Size: Large)   Pulse 76   Ht 5\' 5"  (1.651 m)   Wt 197 lb (89.4 kg)   BMI 32.78 kg/m   Constitutional:  Well nourished. Alert and oriented, No acute distress. HEENT: [place] AT, moist mucus membranes.  Trachea midline Cardiovascular: No clubbing, cyanosis, or edema. Respiratory: Normal respiratory effort, no increased work of breathing. GU: No CVA tenderness.  No  bladder fullness or masses.  Patient with circumcised phallus.  Urethral meatus is patent.  No penile discharge. No penile lesions or rashes. Scrotum without lesions, cysts, rashes and/or edema.  Testicles are located scrotally bilaterally. No masses are appreciated in the testicles. Left and right epididymis are normal.   Large spermatocele in palpated on the left.  I could not appreciate the blue line he was describing on exam.  Neurologic: Grossly intact, no focal deficits, moving all 4 extremities. Psychiatric: Normal mood and affect.  Laboratory Data: Lab Results  Component Value Date   CREATININE 0.97 11/08/2022    Lab Results  Component Value Date   HGBA1C 5.7 (H) 11/08/2022       Component Value Date/Time   CHOL 235 (H) 11/08/2022 0000   HDL 37 (L) 11/08/2022 0000   CHOLHDL 6.4 (H) 11/08/2022 0000   CHOLHDL 4.3 11/05/2018 0849   LDLCALC 162 (H) 11/08/2022 0000   LDLCALC 132 (H) 11/05/2018 0849    Lab Results  Component Value Date   AST 23 11/08/2022   Lab Results  Component Value Date   ALT 72 (H) 11/08/2022   Urinalysis Component     Latest Ref Rng 06/03/2023  Color, Urine     YELLOW  YELLOW   Appearance     CLEAR  CLEAR   Specific Gravity, Urine     1.005 - 1.030  1.015   pH     5.0 - 8.0  6.0   Glucose, UA     NEGATIVE mg/dL NEGATIVE   Hgb urine dipstick     NEGATIVE  TRACE !   Bilirubin Urine     NEGATIVE  NEGATIVE   Ketones, ur     NEGATIVE mg/dL NEGATIVE   Protein     NEGATIVE mg/dL NEGATIVE   Nitrite     NEGATIVE  NEGATIVE   Leukocytes,Ua     NEGATIVE  NEGATIVE   RBC / HPF     0 - 5 RBC/hpf 6-10   WBC, UA     0 - 5 WBC/hpf NONE SEEN   Bacteria, UA     NONE SEEN  FEW !   Squamous Epithelial / HPF     0 - 5 /HPF 0-5   Budding Yeast PRESENT   Granular Casts, UA PRESENT     Legend: ! Abnormal I have reviewed the labs.   Pertinent Imaging: N/A  Assessment & Plan:    1. Penile pain -Reassured the patient that I did not see any  worrisome findings -advised him to send a photo of the area of concern through MyChart as he says the line becomes more pronounced at times  2. Scrotal pain -UA w/ micro heme and  yeast  -Explained to the patient that a hydrocele is a collection of peritoneal fluid between the parietal and visceral layers of the tunica vaginalis, which directly surrounds the testis and spermatic cord.  It is the same layer that forms the peritoneal lining of the abdomen. Hydroceles are believed to arise from an imbalance of secretion and reabsorption of fluid from the tunica vaginalis -He most likely has an idiopathic hydrocele, which is the most common type, that generally arises over a long period of time. Inflammatory conditions of the scrotal contents (epididymitis, torsion, appendiceal torsion) can produce an acute reactive hydrocele, which often resolves with treatment of the underlying condition. His hydrocele does not require intervention at this time.  3. Microscopic hematuria -UA w/ micro heme -urine for atypical's is pending -if urine culture is negative, will need to consider stones as a possible cause of his penile pain    Return for pending urine culture result .  These notes generated with voice recognition software. I apologize for typographical errors.  [name]  [facility] [address]  [address] [place], [place] [address] [phone]

## 2023-06-03 ENCOUNTER — Ambulatory Visit (INDEPENDENT_AMBULATORY_CARE_PROVIDER_SITE_OTHER): Payer: Commercial Managed Care - PPO | Admitting: Urology

## 2023-06-03 ENCOUNTER — Other Ambulatory Visit
Admission: RE | Admit: 2023-06-03 | Discharge: 2023-06-03 | Disposition: A | Discharge status: Home / Self Care | Payer: Commercial Managed Care - PPO | Attending: Urology | Admitting: Urology

## 2023-06-03 ENCOUNTER — Other Ambulatory Visit: Payer: Self-pay

## 2023-06-03 ENCOUNTER — Encounter: Payer: Self-pay | Admitting: Urology

## 2023-06-03 VITALS — BP 141/91 | HR 76 | Ht 65.0 in | Wt 197.0 lb

## 2023-06-03 DIAGNOSIS — N50819 Testicular pain, unspecified: Secondary | ICD-10-CM | POA: Diagnosis present

## 2023-06-03 DIAGNOSIS — N5082 Scrotal pain: Secondary | ICD-10-CM | POA: Diagnosis not present

## 2023-06-03 DIAGNOSIS — N4889 Other specified disorders of penis: Secondary | ICD-10-CM

## 2023-06-03 DIAGNOSIS — R102 Pelvic and perineal pain: Secondary | ICD-10-CM

## 2023-06-03 DIAGNOSIS — R3129 Other microscopic hematuria: Secondary | ICD-10-CM

## 2023-06-03 LAB — URINALYSIS, COMPLETE (UACMP) WITH MICROSCOPIC
Bilirubin Urine: NEGATIVE
Glucose, UA: NEGATIVE mg/dL
Ketones, ur: NEGATIVE mg/dL
Leukocytes,Ua: NEGATIVE
Nitrite: NEGATIVE
Protein, ur: NEGATIVE mg/dL
Specific Gravity, Urine: 1.015 (ref 1.005–1.030)
WBC, UA: NONE SEEN WBC/hpf (ref 0–5)
pH: 6 (ref 5.0–8.0)

## 2023-06-05 ENCOUNTER — Other Ambulatory Visit: Payer: Self-pay | Admitting: *Deleted

## 2023-06-05 MED ORDER — FLUCONAZOLE 100 MG PO TABS: 100.0000 mg | ORAL_TABLET | Freq: Every day | ORAL | 0 refills | Status: AC

## 2023-06-14 ENCOUNTER — Other Ambulatory Visit: Payer: Self-pay | Admitting: Urology

## 2023-06-14 DIAGNOSIS — R399 Unspecified symptoms and signs involving the genitourinary system: Secondary | ICD-10-CM

## 2023-06-14 DIAGNOSIS — R3129 Other microscopic hematuria: Secondary | ICD-10-CM

## 2023-06-14 DIAGNOSIS — R102 Pelvic and perineal pain: Secondary | ICD-10-CM

## 2023-06-24 ENCOUNTER — Ambulatory Visit
Admission: RE | Admit: 2023-06-24 | Discharge: 2023-06-24 | Disposition: A | Discharge status: Home / Self Care | Payer: Commercial Managed Care - PPO | Source: Ambulatory Visit | Attending: Urology | Admitting: Urology

## 2023-06-24 DIAGNOSIS — R3129 Other microscopic hematuria: Secondary | ICD-10-CM | POA: Insufficient documentation

## 2023-06-24 DIAGNOSIS — R102 Pelvic and perineal pain: Secondary | ICD-10-CM | POA: Diagnosis present

## 2023-06-24 DIAGNOSIS — R399 Unspecified symptoms and signs involving the genitourinary system: Secondary | ICD-10-CM | POA: Insufficient documentation

## 2023-07-09 ENCOUNTER — Encounter: Payer: Self-pay | Admitting: Urology

## 2023-07-09 ENCOUNTER — Ambulatory Visit (INDEPENDENT_AMBULATORY_CARE_PROVIDER_SITE_OTHER): Payer: Commercial Managed Care - PPO | Admitting: Urology

## 2023-07-09 VITALS — BP 146/94 | HR 88 | Ht 65.0 in | Wt 197.0 lb

## 2023-07-09 DIAGNOSIS — R3129 Other microscopic hematuria: Secondary | ICD-10-CM

## 2023-07-09 NOTE — Progress Notes (Signed)
Cystoscopy Procedure Note:  Indication: Microscopic hematuria  After informed consent and discussion of the procedure and its risks, Demarques Freelove was positioned and prepped in the standard fashion. Cystoscopy was performed with a flexible cystoscope. The urethra, bladder neck and entire bladder was visualized in a standard fashion. The prostate was small. The ureteral orifices were visualized in their normal location and orientation.  Bladder mucosa grossly normal throughout.  Imaging: CT stone protocol with no abnormalities  Findings: Normal cystoscopy  Assessment and Plan: Normal CT and cystoscopy, his intermittent pelvic and scrotal pain most likely secondary to pelvic floor dysfunction.  Extensive patient information provided, stretching exercises.  RTC 4 weeks symptom check  Legrand Rams, MD 07/09/2023

## 2023-07-09 NOTE — Patient Instructions (Signed)
Pelvic Floor Dysfunction, Male     Pelvic floor dysfunction (PFD) is a condition that results when the group of muscles and connective tissues that support the organs in the pelvis (pelvic floor muscles) do not work well. These muscles and their connections form a sling that supports the colon and bladder. In men, these muscles also support the prostate gland. PFD causes pelvic floor muscles to be too weak, too tight, or both. In PFD, muscle movements are not coordinated. This may cause bowel or bladder problems. It may also cause pain. What are the causes? This condition may be caused by an injury to the pelvic area or by a weakening of pelvic muscles. In many cases, the exact cause is not known. What increases the risk? The following factors may make you more likely to develop PFD: Having chronic bladder tissue inflammation (interstitial cystitis). Being an older person. Being overweight. History of radiation treatment for cancer in the pelvic region. Previous pelvic surgery, such as removal of the prostate gland (prostatectomy). What are the signs or symptoms? Symptoms of this condition vary and may include: Bladder symptoms, such as: Trouble starting urination and emptying the bladder. Frequent urinary tract infections. Leaking urine when coughing, laughing, or exercising (stress incontinence). Having to pass urine urgently or frequently. Pain when passing urine. Bowel symptoms, such as: Constipation. Urgent or frequent bowel movements. Incomplete bowel movements. Painful bowel movements. Leaking stool or gas. Unexplained genital or rectal pain. Genital or rectal muscle spasms. Low back pain. Sexual dysfunction, such as erectile dysfunction, premature ejaculation, or pain during or after sexual activity. How is this diagnosed? This condition is diagnosed based on: Your symptoms and medical history. A physical exam. During the exam, your health care provider may check your  pelvic muscles for tightness, spasm, pain, or weakness. This may include a rectal exam. In some cases, you may have diagnostic tests, such as: Electrical muscle function tests. Urine flow testing. X-ray tests of bowel function. Ultrasound of the pelvic organs. How is this treated? Treatment for this condition depends on your symptoms. Treatment options include: Physical therapy. This may include Kegel exercises to help relax or strengthen the pelvic floor muscles. Biofeedback. This type of therapy provides feedback on how tight your pelvic floor muscles are so that you can learn to control them. Massage therapy. A treatment that involves electrical stimulation of the pelvic floor muscles to help control pain (transcutaneous electrical nerve stimulation, or TENS). Sound wave therapy (ultrasound) to reduce muscle spasms. Medicines, such as: Muscle relaxants. Bladder control medicines. Surgery to reconstruct or support pelvic floor muscles may be an option if other treatments do not help. Follow these instructions at home: Activity Do your usual activities as told by your health care provider. Ask your health care provider if you should modify any activities. Do pelvic floor strengthening or relaxing exercises at home as told by your physical therapist. Lifestyle Maintain a healthy weight. Eat foods that are high in fiber, such as beans, whole grains, and fresh fruits and vegetables. Limit foods that are high in fat and processed sugars, such as fried or sweet foods. Manage stress with relaxation techniques such as yoga or meditation. General instructions If you have problems with leakage: Use absorbable pads or wear padded underwear. Wash your genital and anal area frequently with mild soap. Keep your genital and anal area as clean and dry as possible. Ask your health care provider if you should try a barrier cream to prevent skin irritation. Take warm  baths to relieve pelvic muscle  tension or spasms. Take over-the-counter and prescription medicines only as told by your health care provider. Keep all follow-up visits. How is this prevented? The cause of PFD is not always known, but there are a few things you can do to reduce the risk of developing this condition, including: Staying at a healthy weight. Getting regular exercise. Managing stress. Contact a health care provider if: Your symptoms are not improving with home care. You have signs or symptoms of PFD that get worse. You develop new signs or symptoms. You have signs of a urinary tract infection, such as: Fever. Chills. Increased urinary frequency. A burning feeling when urinating. You have not had a bowel movement in 3 days (constipation). Summary Pelvic floor dysfunction results when the muscles and connective tissues in your pelvic floor do not work well. These muscles and their connections form a sling that supports your colon and bladder. In men, these muscles also support the prostate gland. PFD may be caused by an injury to the pelvic area or by a weakening of pelvic muscles. PFD causes pelvic floor muscles to be too weak, too tight, or a combination of both. Symptoms may vary from person to person. In most cases, PFD can be treated with physical therapies and medicines. Surgery may be an option if other treatments do not help. This information is not intended to replace advice given to you by your health care provider. Make sure you discuss any questions you have with your health care provider. Document Revised: 02/22/2021 Document Reviewed: 02/22/2021 Elsevier Patient Education  2024 [employer].

## 2023-08-11 NOTE — Progress Notes (Unsigned)
08/12/2023 9:13 PM   Carlos Davis 15-Feb-1981 [id number]  Referring provider: Alfredia Ferguson, PA-C [address] [place],  [place] [address]  Urological history: 1. Hydroceles -scrotal US (03/2023) - small bilateral hydroceles  2. Varicocele -scrotal US (03/2023) - right-sided varicocele  3. Epididymal cyst -scrotal US (03/2023) - 2.4 cm left epididymal cyst  No chief complaint on file.  HPI: Carlos Davis is a 42 y.o.  male who presents today for follow up for scrotal pain.   Previous records reviewed.   At his visit on 06/03/2023, he was seen recently by Dr. Richardo Hanks for complaints of short lived dysuria, dull scrotal ache and his penis retracting.  His UA was benign and he was advised to take a short course of Celebrex and explained how increase in weight can result in loss of penile length.  He then he had an elevation in his BP and was advised to take OTC NSAIDS.  He then reached  out to the clinic stating that he was having more pain.  He was started on doxycycline.   He then reached again when his symptoms did not improve.  He also stated the blue/purple spot.  He stated that about four months ago, he felt a quick, sharp pain through the head of  his penis.  He then started to experience scrotal fullness and ache that abates when he is not at work or when he lays down.   He now is experiencing a blue line just below the meatus that worsens when he sits.  This is also uncomfortable when it occurs.  It does not occur when he has an erection.  Patient denies any modifying or aggravating factors.  Patient denies any recent UTI's, gross hematuria, dysuria or suprapubic/flank pain.  Patient denies any fevers, chills, nausea or vomiting.  UA yellow clear, specific gravity 1.015, pH 6.0. trace heme, 0-5 squames, 6-10 RBC's, budding yeast present and granular casts present.     He underwent a cystoscopy on 07/09/2023 and it was normal.   UA ***  PMH: Past Medical  History:  Diagnosis Date   Diverticulitis    Heartburn    Kidney stone    Pancreatitis 2005   Pancreatitis     Surgical History: Past Surgical History:  Procedure Laterality Date   CHOLECYSTECTOMY     CHOLECYSTECTOMY  2002    Home Medications:  Allergies as of 08/12/2023   No Known Allergies      Medication List    as of August 11, 2023  9:13 PM   You have not been prescribed any medications.     Allergies: No Known Allergies  Family History: Family History  Problem Relation Age of Onset   Charcot-Marie-Tooth disease Mother    Arthritis Father    High blood pressure Father    Other Daughter        Hydronephrosis (intrauterine)   Colon cancer Maternal Uncle 60   Diabetes Maternal Grandmother    Colon cancer Maternal Grandfather 40 - 49   Heart attack Maternal Grandfather 47 - 49   Diabetes Paternal Grandmother    Aneurysm Paternal Grandfather 63 - 53       Brain, sudden death   Charcot-Marie-Tooth disease Other    Charcot-Marie-Tooth disease Other     Social History:  reports that he quit smoking about 21 months ago. His smoking use included cigarettes. His smokeless tobacco use includes snuff. He reports current alcohol use of about 12.0 standard  drinks of alcohol per week. He reports that he does not currently use drugs.  ROS: Pertinent ROS in HPI  Physical Exam: There were no vitals taken for this visit.  Constitutional:  Well nourished. Alert and oriented, No acute distress. HEENT: [place] AT, moist mucus membranes.  Trachea midline, no masses. Cardiovascular: No clubbing, cyanosis, or edema. Respiratory: Normal respiratory effort, no increased work of breathing. GI: Abdomen is soft, non tender, non distended, no abdominal masses. Liver and spleen not palpable.  No hernias appreciated.  Stool sample for occult testing is not indicated.   GU: No CVA tenderness.  No bladder fullness or masses.  Patient with circumcised/uncircumcised phallus. ***Foreskin  easily retracted***  Urethral meatus is patent.  No penile discharge. No penile lesions or rashes. Scrotum without lesions, cysts, rashes and/or edema.  Testicles are located scrotally bilaterally. No masses are appreciated in the testicles. Left and right epididymis are normal. Rectal: Patient with  normal sphincter tone. Anus and perineum without scarring or rashes. No rectal masses are appreciated. Prostate is approximately *** grams, *** nodules are appreciated. Seminal vesicles are normal. Skin: No rashes, bruises or suspicious lesions. Lymph: No cervical or inguinal adenopathy. Neurologic: Grossly intact, no focal deficits, moving all 4 extremities. Psychiatric: Normal mood and affect.   Laboratory Data: Urinalysis ***  I have reviewed the labs.   Pertinent Imaging: N/A  Assessment & Plan:    1. Penile pain -Reassured the patient that I did not see any worrisome findings -advised him to send a photo of the area of concern through MyChart as he says the line becomes more pronounced at times  2. Scrotal pain -UA w/ micro heme and yeast  -Explained to the patient that a hydrocele is a collection of peritoneal fluid between the parietal and visceral layers of the tunica vaginalis, which directly surrounds the testis and spermatic cord.  It is the same layer that forms the peritoneal lining of the abdomen. Hydroceles are believed to arise from an imbalance of secretion and reabsorption of fluid from the tunica vaginalis -He most likely has an idiopathic hydrocele, which is the most common type, that generally arises over a long period of time. Inflammatory conditions of the scrotal contents (epididymitis, torsion, appendiceal torsion) can produce an acute reactive hydrocele, which often resolves with treatment of the underlying condition. His hydrocele does not require intervention at this time.  3. Microscopic hematuria -former smoker -CT renal study (05/2023) - Negative for worrisome  GU findings  -recent cysto negative   No follow-ups on file.  These notes generated with voice recognition software. I apologize for typographical errors.  [name]  [facility] [address]  [address] [place], [place] [address] [phone]

## 2023-08-12 ENCOUNTER — Ambulatory Visit (INDEPENDENT_AMBULATORY_CARE_PROVIDER_SITE_OTHER): Payer: Commercial Managed Care - PPO | Admitting: Urology

## 2023-08-12 ENCOUNTER — Encounter: Payer: Self-pay | Admitting: Urology

## 2023-08-12 ENCOUNTER — Other Ambulatory Visit
Admission: RE | Admit: 2023-08-12 | Discharge: 2023-08-12 | Disposition: A | Discharge status: Home / Self Care | Payer: Commercial Managed Care - PPO | Attending: Urology | Admitting: Urology

## 2023-08-12 VITALS — BP 134/82 | HR 99 | Ht 65.0 in | Wt 197.0 lb

## 2023-08-12 DIAGNOSIS — R3129 Other microscopic hematuria: Secondary | ICD-10-CM | POA: Insufficient documentation

## 2023-08-12 DIAGNOSIS — N50819 Testicular pain, unspecified: Secondary | ICD-10-CM | POA: Diagnosis present

## 2023-08-12 DIAGNOSIS — N4889 Other specified disorders of penis: Secondary | ICD-10-CM

## 2023-08-12 DIAGNOSIS — N5082 Scrotal pain: Secondary | ICD-10-CM | POA: Diagnosis not present

## 2023-08-12 DIAGNOSIS — M6289 Other specified disorders of muscle: Secondary | ICD-10-CM

## 2023-08-12 LAB — URINALYSIS, COMPLETE (UACMP) WITH MICROSCOPIC
Bilirubin Urine: NEGATIVE
Glucose, UA: NEGATIVE mg/dL
Ketones, ur: NEGATIVE mg/dL
Leukocytes,Ua: NEGATIVE
Nitrite: NEGATIVE
Protein, ur: NEGATIVE mg/dL
Specific Gravity, Urine: >1.03 — ABNORMAL HIGH (ref 1.005–1.030)
pH: 5.5 (ref 5.0–8.0)

## 2023-08-19 LAB — MISC LABCORP TEST (SEND OUT): Labcorp test code: 86884

## 2024-02-16 NOTE — Progress Notes (Deleted)
 02/17/2024 4:32 PM   Carlos Davis 01-11-1981 [id number]  Referring provider: Trenton Frock, PA-C [address] [place],  [place] [address]  Urological history: 1. Hydroceles -scrotal US  (03/2023) - small bilateral hydroceles  2. Varicocele -scrotal US  (03/2023) - right-sided varicocele  3. Epididymal cyst -scrotal US  (03/2023) - 2.4 cm left epididymal cyst  4. Intermediate risk hematuria -former smoker -contrast CT (04/2022) - NED -non contrast CT (05/2023) - NED -cysto (06/2023) - NED  No chief complaint on file.  HPI: Carlos Davis is a 43 y.o.  man who presents today for a 6 months follow up for scrotal pain.   Previous records reviewed.    PMH: Past Medical History:  Diagnosis Date   Diverticulitis    Heartburn    Kidney stone    Pancreatitis 2005   Pancreatitis     Surgical History: Past Surgical History:  Procedure Laterality Date   CHOLECYSTECTOMY     CHOLECYSTECTOMY  2002    Home Medications:  Allergies as of 02/17/2024   No Known Allergies      Medication List    as of February 16, 2024  4:32 PM   You have not been prescribed any medications.     Allergies: No Known Allergies  Family History: Family History  Problem Relation Age of Onset   Charcot-Marie-Tooth disease Mother    Arthritis Father    High blood pressure Father    Other Daughter        Hydronephrosis (intrauterine)   Colon cancer Maternal Uncle 60   Diabetes Maternal Grandmother    Colon cancer Maternal Grandfather 40 - 49   Heart attack Maternal Grandfather 68 - 49   Diabetes Paternal Grandmother    Aneurysm Paternal Grandfather 17 - 59       Brain, sudden death   Charcot-Marie-Tooth disease Other    Charcot-Marie-Tooth disease Other     Social History:  reports that he quit smoking about 2 years ago. His smoking use included cigarettes. His smokeless tobacco use includes snuff. He reports current alcohol use of about 12.0 standard drinks  of alcohol per week. He reports that he does not currently use drugs.  ROS: Pertinent ROS in HPI  Physical Exam: There were no vitals taken for this visit.  Constitutional:  Well nourished. Alert and oriented, No acute distress. HEENT: [place] AT, moist mucus membranes.  Trachea midline, no masses. Cardiovascular: No clubbing, cyanosis, or edema. Respiratory: Normal respiratory effort, no increased work of breathing. GI: Abdomen is soft, non tender, non distended, no abdominal masses. Liver and spleen not palpable.  No hernias appreciated.  Stool sample for occult testing is not indicated.   GU: No CVA tenderness.  No bladder fullness or masses.  Patient with circumcised/uncircumcised phallus. ***Foreskin easily retracted***  Urethral meatus is patent.  No penile discharge. No penile lesions or rashes. Scrotum without lesions, cysts, rashes and/or edema.  Testicles are located scrotally bilaterally. No masses are appreciated in the testicles. Left and right epididymis are normal. Rectal: Patient with  normal sphincter tone. Anus and perineum without scarring or rashes. No rectal masses are appreciated. Prostate is approximately *** grams, *** nodules are appreciated. Seminal vesicles are normal. Skin: No rashes, bruises or suspicious lesions. Lymph: No cervical or inguinal adenopathy. Neurologic: Grossly intact, no focal deficits, moving all 4 extremities. Psychiatric: Normal mood and affect.   Laboratory Data: N/A   Pertinent Imaging: N/A  Assessment & Plan:    1. Penile  pain/Pelvic floor dysfunction  -***  2. Intermediate risk hematuria -former smoker -contrast CT (04/2022) - NED -CT renal study (05/2023) - NED -cysto (09/20240) - NED -no reports of gross heme  No follow-ups on file.  These notes generated with voice recognition software. I apologize for typographical errors.  Briant Camper  [facility] [address]  [address] [place], [place] [address] [phone]

## 2024-02-17 ENCOUNTER — Encounter: Payer: Self-pay | Admitting: Urology

## 2024-02-17 ENCOUNTER — Ambulatory Visit: Payer: Self-pay | Admitting: Urology

## 2024-02-17 DIAGNOSIS — N4889 Other specified disorders of penis: Secondary | ICD-10-CM

## 2024-02-17 DIAGNOSIS — R319 Hematuria, unspecified: Secondary | ICD-10-CM

## 2024-02-17 DIAGNOSIS — M6289 Other specified disorders of muscle: Secondary | ICD-10-CM

## 2024-04-07 ENCOUNTER — Emergency Department
Admission: EM | Admit: 2024-04-07 | Discharge: 2024-04-07 | Disposition: A | Discharge status: Home / Self Care | Attending: Emergency Medicine | Admitting: Emergency Medicine

## 2024-04-07 ENCOUNTER — Encounter: Payer: Self-pay | Admitting: Emergency Medicine

## 2024-04-07 ENCOUNTER — Emergency Department

## 2024-04-07 ENCOUNTER — Other Ambulatory Visit: Payer: Self-pay

## 2024-04-07 DIAGNOSIS — K5792 Diverticulitis of intestine, part unspecified, without perforation or abscess without bleeding: Secondary | ICD-10-CM

## 2024-04-07 DIAGNOSIS — R1030 Lower abdominal pain, unspecified: Secondary | ICD-10-CM | POA: Diagnosis present

## 2024-04-07 DIAGNOSIS — K5732 Diverticulitis of large intestine without perforation or abscess without bleeding: Secondary | ICD-10-CM | POA: Insufficient documentation

## 2024-04-07 LAB — URINALYSIS, COMPLETE (UACMP) WITH MICROSCOPIC
Bacteria, UA: NONE SEEN
Bilirubin Urine: NEGATIVE
Glucose, UA: NEGATIVE mg/dL
Ketones, ur: NEGATIVE mg/dL
Nitrite: NEGATIVE
Protein, ur: NEGATIVE mg/dL
Specific Gravity, Urine: 1.021 (ref 1.005–1.030)
pH: 6 (ref 5.0–8.0)

## 2024-04-07 LAB — COMPREHENSIVE METABOLIC PANEL WITH GFR
ALT: 20 U/L (ref 0–44)
AST: 14 U/L — ABNORMAL LOW (ref 15–41)
Albumin: 4.4 g/dL (ref 3.5–5.0)
Alkaline Phosphatase: 47 U/L (ref 38–126)
Anion gap: 8 (ref 5–15)
BUN: 17 mg/dL (ref 6–20)
CO2: 24 mmol/L (ref 22–32)
Calcium: 9.1 mg/dL (ref 8.9–10.3)
Chloride: 106 mmol/L (ref 98–111)
Creatinine, Ser: 0.76 mg/dL (ref 0.61–1.24)
GFR, Estimated: >60 mL/min (ref >60)
Glucose, Bld: 94 mg/dL (ref 70–99)
Potassium: 4.1 mmol/L (ref 3.5–5.1)
Sodium: 138 mmol/L (ref 135–145)
Total Bilirubin: 0.8 mg/dL (ref 0.0–1.2)
Total Protein: 7.1 g/dL (ref 6.5–8.1)

## 2024-04-07 LAB — CBC
HCT: 45.2 % (ref 39.0–52.0)
Hemoglobin: 15.8 g/dL (ref 13.0–17.0)
MCH: 29.2 pg (ref 26.0–34.0)
MCHC: 35 g/dL (ref 30.0–36.0)
MCV: 83.4 fL (ref 80.0–100.0)
Platelets: 237 10*3/uL (ref 150–400)
RBC: 5.42 MIL/uL (ref 4.22–5.81)
RDW: 12.3 % (ref 11.5–15.5)
WBC: 10.5 10*3/uL (ref 4.0–10.5)
nRBC: 0 % (ref 0.0–0.2)

## 2024-04-07 LAB — LIPASE, BLOOD: Lipase: 38 U/L (ref 11–51)

## 2024-04-07 MED ORDER — IOHEXOL 300 MG/ML  SOLN
100.0000 mL | Freq: Once | INTRAMUSCULAR | Status: AC | PRN
  Administered 2024-04-07: 100 mL via INTRAVENOUS

## 2024-04-07 MED ORDER — DOCUSATE SODIUM 100 MG PO CAPS: 100.0000 mg | ORAL_CAPSULE | Freq: Two times a day (BID) | ORAL | 0 refills | Status: AC

## 2024-04-07 MED ORDER — AMOXICILLIN-POT CLAVULANATE 875-125 MG PO TABS: 1.0000 | ORAL_TABLET | Freq: Two times a day (BID) | ORAL | 0 refills | Status: AC

## 2024-04-07 NOTE — ED Provider Notes (Signed)
 [facility] Provider Note    Event Date/Time   First MD Initiated Contact with Patient 04/07/24 [phone]     (approximate)  History   Chief Complaint: Abdominal Pain  HPI  Carlos Davis is a 43 y.o. male with a past medical history of diverticulitis, kidney stones, pancreatitis, presents to the emergency department for lower abdominal pain.  According to the patient since yesterday he has been experiencing moderate pain in the low abdomen/suprapubic region.  Patient denies any urinary symptoms no dysuria or hematuria.  No diarrhea.  Patient states the pain is worse if he lies on his right side and somewhat relieved when he lays on his left side.  Physical Exam   Triage Vital Signs: ED Triage Vitals  Encounter Vitals Group     BP 04/07/24 0838 (!) 123/90     Systolic BP Percentile --      Diastolic BP Percentile --      Pulse Rate 04/07/24 0838 89     Resp 04/07/24 0838 18     Temp 04/07/24 0838 98.3 F (36.8 C)     Temp Source 04/07/24 0838 Oral     SpO2 04/07/24 0838 99 %     Weight 04/07/24 0837 160 lb (72.6 kg)     Height 04/07/24 0837 5\' 5"  (1.651 m)     Head Circumference --      Peak Flow --      Pain Score 04/07/24 0837 6     Pain Loc --      Pain Education --      Exclude from Growth Chart --     Most recent vital signs: Vitals:   04/07/24 0838  BP: (!) 123/90  Pulse: 89  Resp: 18  Temp: 98.3 F (36.8 C)  SpO2: 99%    General: Awake, no distress.  CV:  Good peripheral perfusion.  Regular rate and rhythm  Resp:  Normal effort.  Equal breath sounds bilaterally.  Abd:  No distention.  Soft, mild right lower quadrant tenderness moderate suprapubic tenderness.  Otherwise benign abdomen.  Mild rebound tenderness to the right lower quadrant.  ED Results / Procedures / Treatments   RADIOLOGY  I have reviewed interpret the CT images.  No obvious significant abnormality seen on my evaluation. Patient CT scan read by radiology  consistent with acute diverticulitis of the distal sigmoid colon.  Uncomplicated.  Moderate amount of constipation.  MEDICATIONS ORDERED IN ED: Medications - No data to display   IMPRESSION / MDM / ASSESSMENT AND PLAN / ED COURSE  I reviewed the triage vital signs and the nursing notes.  Patient's presentation is most consistent with acute presentation with potential threat to life or bodily function.  Patient presents emergency department for lower abdominal pain since yesterday that is worsened overnight.  Differential is quite broad but would include diverticulitis, appendicitis, ureterolithiasis, UTI or pyelonephritis among other interabdominal pathology.  Will check labs including a CBC chemistry lipase urinalysis and obtain CT imaging the abdomen pelvis to further evaluate.  Patient agreeable to plan of care.  Patient's workup today is consistent with acute diverticulitis as well as constipation.  Patient's lab work is reassuring with a normal CBC with normal white blood cell count reassuring chemistry with normal LFTs and lipase.  Reassuring urinalysis.  Will place patient on antibiotics Augmentin twice daily as well as Colace have the patient follow-up with his doctor.  Patient agreeable to plan of care.  Discussed dietary precautions.  FINAL  CLINICAL IMPRESSION(S) / ED DIAGNOSES   Abdominal pain Acute diverticulitis   Note:  This document was prepared using Dragon voice recognition software and may include unintentional dictation errors.   Ruth Cove, MD 04/07/24 1039

## 2024-04-07 NOTE — ED Notes (Signed)
 Patient denies nausea at this time or increased pain with movement.

## 2024-04-07 NOTE — ED Notes (Signed)
 Patient declined discharge vital signs.

## 2024-04-07 NOTE — ED Triage Notes (Signed)
 Patient to ED via POV for lower abd pain. Sent over from [facility] for imaging. Denies N/V. Ongoing since yesterday.
